# Patient Record
Sex: Female | Born: 1990 | Race: Black or African American | Hispanic: No | Marital: Married | Smoking: Never smoker
Health system: Southern US, Community
[De-identification: ages and names within clinical notes are randomized; demographics above are authoritative.]

---

## 2016-11-17 ENCOUNTER — Emergency Department (HOSPITAL_COMMUNITY): Payer: BLUE CROSS/BLUE SHIELD

## 2016-11-17 ENCOUNTER — Emergency Department (HOSPITAL_COMMUNITY)
Admission: EM | Admit: 2016-11-17 | Discharge: 2016-11-17 | Disposition: A | Discharge status: Home / Self Care | Payer: BLUE CROSS/BLUE SHIELD | Attending: Emergency Medicine | Admitting: Emergency Medicine

## 2016-11-17 ENCOUNTER — Encounter (HOSPITAL_COMMUNITY): Payer: Self-pay | Admitting: Emergency Medicine

## 2016-11-17 DIAGNOSIS — R1030 Lower abdominal pain, unspecified: Secondary | ICD-10-CM | POA: Insufficient documentation

## 2016-11-17 DIAGNOSIS — R112 Nausea with vomiting, unspecified: Secondary | ICD-10-CM | POA: Diagnosis present

## 2016-11-17 DIAGNOSIS — Z79899 Other long term (current) drug therapy: Secondary | ICD-10-CM | POA: Diagnosis not present

## 2016-11-17 DIAGNOSIS — R197 Diarrhea, unspecified: Secondary | ICD-10-CM | POA: Diagnosis not present

## 2016-11-17 DIAGNOSIS — R740 Nonspecific elevation of levels of transaminase and lactic acid dehydrogenase [LDH]: Secondary | ICD-10-CM | POA: Diagnosis not present

## 2016-11-17 DIAGNOSIS — R52 Pain, unspecified: Secondary | ICD-10-CM

## 2016-11-17 DIAGNOSIS — R7401 Elevation of levels of liver transaminase levels: Secondary | ICD-10-CM

## 2016-11-17 LAB — COMPREHENSIVE METABOLIC PANEL
ALBUMIN: 3.8 g/dL (ref 3.5–5.0)
ALK PHOS: 97 U/L (ref 38–126)
ALT: 199 U/L — ABNORMAL HIGH (ref 14–54)
ANION GAP: 10 (ref 5–15)
AST: 157 U/L — ABNORMAL HIGH (ref 15–41)
BUN: 8 mg/dL (ref 6–20)
CO2: 24 mmol/L (ref 22–32)
CREATININE: 0.83 mg/dL (ref 0.44–1.00)
Calcium: 9.8 mg/dL (ref 8.9–10.3)
Chloride: 102 mmol/L (ref 101–111)
GFR calc non Af Amer: >60 mL/min (ref >60)
Glucose, Bld: 112 mg/dL — ABNORMAL HIGH (ref 65–99)
POTASSIUM: 3.2 mmol/L — AB (ref 3.5–5.1)
SODIUM: 136 mmol/L (ref 135–145)
Total Bilirubin: 0.8 mg/dL (ref 0.3–1.2)
Total Protein: 8.3 g/dL — ABNORMAL HIGH (ref 6.5–8.1)

## 2016-11-17 LAB — I-STAT BETA HCG BLOOD, ED (MC, WL, AP ONLY)

## 2016-11-17 LAB — CBC
HCT: 45.5 % (ref 36.0–46.0)
Hemoglobin: 15.8 g/dL — ABNORMAL HIGH (ref 12.0–15.0)
MCH: 30.9 pg (ref 26.0–34.0)
MCHC: 34.7 g/dL (ref 30.0–36.0)
MCV: 89 fL (ref 78.0–100.0)
PLATELETS: 342 10*3/uL (ref 150–400)
RBC: 5.11 MIL/uL (ref 3.87–5.11)
RDW: 12.8 % (ref 11.5–15.5)
WBC: 5.3 10*3/uL (ref 4.0–10.5)

## 2016-11-17 LAB — URINALYSIS, ROUTINE W REFLEX MICROSCOPIC
BILIRUBIN URINE: NEGATIVE
Bacteria, UA: NONE SEEN
Glucose, UA: NEGATIVE mg/dL
Hgb urine dipstick: NEGATIVE
Ketones, ur: NEGATIVE mg/dL
Leukocytes, UA: NEGATIVE
Nitrite: NEGATIVE
Protein, ur: 30 mg/dL — AB
SPECIFIC GRAVITY, URINE: 1.021 (ref 1.005–1.030)
pH: 6 (ref 5.0–8.0)

## 2016-11-17 LAB — WET PREP, GENITAL
Sperm: NONE SEEN
TRICH WET PREP: NONE SEEN
YEAST WET PREP: NONE SEEN

## 2016-11-17 LAB — PROTIME-INR
INR: 1.07
Prothrombin Time: 13.9 seconds (ref 11.4–15.2)

## 2016-11-17 LAB — LIPASE, BLOOD: Lipase: 18 U/L (ref 11–51)

## 2016-11-17 MED ORDER — TRAMADOL HCL 50 MG PO TABS: 50.0000 mg | ORAL_TABLET | Freq: Four times a day (QID) | ORAL | 0 refills | Status: DC | PRN

## 2016-11-17 MED ORDER — IOPAMIDOL (ISOVUE-300) INJECTION 61%
INTRAVENOUS | Status: AC
  Administered 2016-11-17: 100 mL
  Filled 2016-11-17: qty 100

## 2016-11-17 MED ORDER — ONDANSETRON HCL 4 MG PO TABS: 4.0000 mg | ORAL_TABLET | Freq: Three times a day (TID) | ORAL | 0 refills | Status: DC | PRN

## 2016-11-17 MED ORDER — ONDANSETRON 4 MG PO TBDP
4.0000 mg | ORAL_TABLET | Freq: Once | ORAL | Status: AC | PRN
  Administered 2016-11-17: 4 mg via ORAL

## 2016-11-17 MED ORDER — FAMOTIDINE IN NACL 20-0.9 MG/50ML-% IV SOLN
20.0000 mg | Freq: Once | INTRAVENOUS | Status: AC
  Administered 2016-11-17: 20 mg via INTRAVENOUS
  Filled 2016-11-17: qty 50

## 2016-11-17 MED ORDER — MORPHINE SULFATE (PF) 4 MG/ML IV SOLN
4.0000 mg | Freq: Once | INTRAVENOUS | Status: AC
  Administered 2016-11-17: 4 mg via INTRAVENOUS
  Filled 2016-11-17: qty 1

## 2016-11-17 MED ORDER — ONDANSETRON HCL 4 MG/2ML IJ SOLN
4.0000 mg | Freq: Once | INTRAMUSCULAR | Status: AC
  Administered 2016-11-17: 4 mg via INTRAVENOUS
  Filled 2016-11-17: qty 2

## 2016-11-17 MED ORDER — ONDANSETRON 4 MG PO TBDP
ORAL_TABLET | ORAL | Status: DC
Start: 2016-11-17 — End: 2016-11-17
  Filled 2016-11-17: qty 1

## 2016-11-17 MED ORDER — HYDROCODONE-ACETAMINOPHEN 5-325 MG PO TABS
1.0000 | ORAL_TABLET | Freq: Once | ORAL | Status: AC
  Administered 2016-11-17: 1 via ORAL
  Filled 2016-11-17: qty 1

## 2016-11-17 MED ORDER — SODIUM CHLORIDE 0.9 % IV BOLUS (SEPSIS)
1000.0000 mL | Freq: Once | INTRAVENOUS | Status: AC
  Administered 2016-11-17: 1000 mL via INTRAVENOUS

## 2016-11-17 NOTE — ED Provider Notes (Signed)
[facility]-EMERGENCY DEPT Provider Note   CSN: [id number] Arrival date & time: 11/17/16  0807     History   Chief Complaint Chief Complaint  Patient presents with  . Nausea  . Emesis  . Diarrhea   HPI   Blood pressure 116/80, pulse 82, temperature 98.2 F (36.8 C), temperature source Oral, resp. rate 16, last menstrual period 11/11/2016, SpO2 100 %.  Kristine Nunez is a 25 y.o. female complaining of nonbloody, nonbilious, non-coffee ground emesis with associated watery and yellowish diarrhea onset 3 days ago with severe lower abdominal pain. She denies fevers, chills, sick contacts, dysuria, hematuria, flank pain, abnormal vaginal discharge. She states that pain was severe, 10 out of 10 and after she was given Zofran in the waiting room the pain eased to 7 out of 10.  History reviewed. No pertinent past medical history.  There are no active problems to display for this patient.   Past Surgical History:  Procedure Laterality Date  . CESAREAN SECTION     x 3    OB History    No data available       Home Medications    Prior to Admission medications   Medication Sig Start Date End Date Taking? Authorizing Provider  ondansetron (ZOFRAN) 4 MG tablet Take 1 tablet (4 mg total) by mouth every 8 (eight) hours as needed for nausea or vomiting. 11/17/16   Joni Reining Besan Ketchem, PA-C    Family History No family history on file.  Social History Social History  Substance Use Topics  . Smoking status: Never Smoker  . Smokeless tobacco: Never Used  . Alcohol use No     Allergies   Patient has no known allergies.   Review of Systems Review of Systems  10 systems reviewed and found to be negative, except as noted in the HPI.   Physical Exam Updated Vital Signs BP 102/72   Pulse 76   Temp 98.2 F (36.8 C) (Oral)   Resp 16   LMP 11/11/2016   SpO2 99%   Physical Exam  Constitutional: She is oriented to person, place, and time. She appears well-developed and  well-nourished. No distress.  HENT:  Head: Normocephalic and atraumatic.  Mouth/Throat: Oropharynx is clear and moist.  Eyes: Conjunctivae and EOM are normal. Pupils are equal, round, and reactive to light.  Neck: Normal range of motion.  Cardiovascular: Normal rate, regular rhythm and intact distal pulses.   Pulmonary/Chest: Effort normal and breath sounds normal.  Abdominal: Soft. There is no tenderness.  Mild suprapubic abdominal pain with no guarding or rebound, Murphy's sign negative.  Genitourinary:  Genitourinary Comments: Pelvic exam a chaperoned by RN: No rashes or lesions, scant, white, non-foul-smelling vaginal discharge, no cervical motion or adnexal tenderness.  Musculoskeletal: Normal range of motion.  Neurological: She is alert and oriented to person, place, and time.  Skin: She is not diaphoretic.  Psychiatric: She has a normal mood and affect.  Nursing note and vitals reviewed.    ED Treatments / Results  Labs (all labs ordered are listed, but only abnormal results are displayed) Labs Reviewed  WET PREP, GENITAL - Abnormal; Notable for the following:       Result Value   Clue Cells Wet Prep HPF POC PRESENT (*)    WBC, Wet Prep HPF POC FEW (*)    All other components within normal limits  COMPREHENSIVE METABOLIC PANEL - Abnormal; Notable for the following:    Potassium 3.2 (*)    Glucose, Bld 112 (*)  Total Protein 8.3 (*)    AST 157 (*)    ALT 199 (*)    All other components within normal limits  CBC - Abnormal; Notable for the following:    Hemoglobin 15.8 (*)    All other components within normal limits  URINALYSIS, ROUTINE W REFLEX MICROSCOPIC - Abnormal; Notable for the following:    Color, Urine AMBER (*)    Protein, ur 30 (*)    Squamous Epithelial / LPF 0-5 (*)    All other components within normal limits  LIPASE, BLOOD  PROTIME-INR  HEPATITIS PANEL, ACUTE  RPR  HIV ANTIBODY (ROUTINE TESTING)  I-STAT BETA HCG BLOOD, ED ([facility], [facility], [facility] ONLY)    GC/CHLAMYDIA PROBE AMP (Susan Moore) NOT AT [facility]    EKG  EKG Interpretation None       Radiology Ct Abdomen Pelvis W Contrast  Result Date: 11/17/2016 CLINICAL DATA:  Abdomen pain, nausea vomiting for 3 days. EXAM: CT ABDOMEN AND PELVIS WITH CONTRAST TECHNIQUE: Multidetector CT imaging of the abdomen and pelvis was performed using the standard protocol following bolus administration of intravenous contrast. CONTRAST:  100mL ISOVUE-300 IOPAMIDOL (ISOVUE-300) INJECTION 61% COMPARISON:  None. FINDINGS: Lower chest: There is atelectasis of the bilateral posterior lung bases. Heart size is normal. Hepatobiliary: No focal lesion is identified in the liver. There is hazy stranding surrounding the gallbladder. The biliary tree is normal. Pancreas: Unremarkable. No pancreatic ductal dilatation or surrounding inflammatory changes. Spleen: Normal in size without focal abnormality. Adrenals/Urinary Tract: Adrenal glands are unremarkable. Kidneys are normal, without renal calculi, focal lesion, or hydronephrosis. Bladder is unremarkable. Stomach/Bowel: Stomach is within normal limits. Appendix appears normal. No evidence of bowel wall thickening, distention, or inflammatory changes. Vascular/Lymphatic: No significant vascular findings are present. No enlarged abdominal or pelvic lymph nodes. Reproductive: Small recently ruptured right ovarian cyst is identified. The uterus is normal. Other: No abdominal wall hernia or abnormality. No abdominopelvic ascites. Musculoskeletal: No acute or significant osseous findings. IMPRESSION: Hazy stranding surrounding the gallbladder which may be due to. Gallbladder inflammatory change. Consider further evaluation with ultrasound of gallbladder. Recently ruptured small right ovarian cyst. Electronically Signed   By: Sherian ReinWei-Chen  Lin M.D.   On: 11/17/2016 16:22   [place] Abdomen Limited Ruq  Result Date: 11/17/2016 CLINICAL DATA:  Right upper quadrant pain x3 days EXAM: US ABDOMEN  LIMITED - RIGHT UPPER QUADRANT COMPARISON:  None. FINDINGS: Gallbladder: Biliary sludge within the gallbladder with cholelithiasis noted. No sonographic Eulah PontMurphy sign reported. No pericholecystic fluid is identified. Gallbladder wall thickness is top normal at 3 mm. Common bile duct: Diameter: 4 mm Liver: No focal lesion identified. Left lobe of the liver was obscured by bowel gas. Within normal limits in parenchymal echogenicity. IMPRESSION: Cholelithiasis with biliary sludge. No conclusive evidence for acute cholecystitis. Electronically Signed   By: Tollie Ethavid  Kwon M.D.   On: 11/17/2016 18:01    Procedures Procedures (including critical care time)  Medications Ordered in ED Medications  ondansetron (ZOFRAN-ODT) 4 MG disintegrating tablet (not administered)  ondansetron (ZOFRAN-ODT) disintegrating tablet 4 mg (4 mg Oral Given 11/17/16 0834)  morphine 4 MG/ML injection 4 mg (4 mg Intravenous Given 11/17/16 1419)  ondansetron (ZOFRAN) injection 4 mg (4 mg Intravenous Given 11/17/16 1419)  sodium chloride 0.9 % bolus 1,000 mL (0 mLs Intravenous Stopped 11/17/16 1831)  famotidine (PEPCID) IVPB 20 mg premix (0 mg Intravenous Stopped 11/17/16 1658)  iopamidol (ISOVUE-300) 61 % injection (100 mLs  Contrast Given 11/17/16 1526)     Initial Impression /  Assessment and Plan / ED Course  I have reviewed the triage vital signs and the nursing notes.  Pertinent labs & imaging results that were available during my care of the patient were reviewed by me and considered in my medical decision making (see chart for details).  Clinical Course     Vitals:   11/17/16 1815 11/17/16 1830 11/17/16 1845 11/17/16 1900  BP: 104/71 95/73 106/73 102/72  Pulse: 74 68 102 76  Resp:      Temp:      TempSrc:      SpO2: 99% 99% 97% 99%    Medications  ondansetron (ZOFRAN-ODT) 4 MG disintegrating tablet (not administered)  ondansetron (ZOFRAN-ODT) disintegrating tablet 4 mg (4 mg Oral Given 11/17/16 0834)  morphine  4 MG/ML injection 4 mg (4 mg Intravenous Given 11/17/16 1419)  ondansetron (ZOFRAN) injection 4 mg (4 mg Intravenous Given 11/17/16 1419)  sodium chloride 0.9 % bolus 1,000 mL (0 mLs Intravenous Stopped 11/17/16 1831)  famotidine (PEPCID) IVPB 20 mg premix (0 mg Intravenous Stopped 11/17/16 1658)  iopamidol (ISOVUE-300) 61 % injection (100 mLs  Contrast Given 11/17/16 1526)    Nayzeth Gubler is 25 y.o. female presenting with Nausea vomiting diarrhea onset 3 days ago with severe lower abdominal pain. Abdominal exam is nonsurgical. Patient with a moderate transaminitis AST 150, ALT over 190. Pelvic exam benign. Will obtain CT, acute hepatitis and INR. Patient given morphine and Zofran for comfort.  Wet prep with clue cells however she is not complaining of any vaginal discharge. INR is normal.  CT was an abnormality in the right upper quadrant they recommend evaluating with an ultrasound, ultrasound with no signs of cholecystitis. Repeat abdominal exam remains nonsurgical. Patient states she feels much better, is tolerating by mouth's. Counseled her on return precautions. Patient is given Zofran and work note.   Evaluation does not show pathology that would require ongoing emergent intervention or inpatient treatment. Pt is hemodynamically stable and mentating appropriately. Discussed findings and plan with patient/guardian, who agrees with care plan. All questions answered. Return precautions discussed and outpatient follow up given.    Final Clinical Impressions(s) / ED Diagnoses   Final diagnoses:  Pain  Transaminitis  Nausea vomiting and diarrhea    New Prescriptions New Prescriptions   ONDANSETRON (ZOFRAN) 4 MG TABLET    Take 1 tablet (4 mg total) by mouth every 8 (eight) hours as needed for nausea or vomiting.     Wynetta Emeryicole Adaisha Campise, PA-C 11/17/16 1907    Gwyneth SproutWhitney Plunkett, MD 11/19/16 1920

## 2016-11-17 NOTE — ED Notes (Signed)
Pt requested rn discuss stomach pain with EDP prior to d/c.

## 2016-11-17 NOTE — ED Triage Notes (Signed)
Swahili interpreter used via [employer].

## 2016-11-17 NOTE — ED Notes (Signed)
Pt transported to CT ?

## 2016-11-17 NOTE — Discharge Instructions (Signed)
°  Push maji maji: kuchukua sips ya mara kwa mara ya maji au Gatorade, usinywe soda yoyote, juisi au vinywaji vya caffeinated.  Punguza polepole chakula cha imara kama unavyotaka. Epuka chakula ambacho ni spicy, kina maziwa na / au una maudhui ya juu ya mafuta.  NSAID za Aviod (aspirin, motrin, ibuprofen, naproxen, Aleve na [place]ika) kwa udhibiti wa maumivu kwa sababu watawashawishi tumbo lako.  Usisita kurudi kwenye idara ya dharura kwa ajili ya dalili yoyote mpya, mbaya au kuhusu dalili.  Push fluids: take small frequent sips of water or Gatorade, do not drink any soda, juice or caffeinated beverages.    Slowly resume solid diet as desired. Avoid food that are spicy, contain dairy and/or have high fat content.  Aviod NSAIDs (aspirin, motrin, ibuprofen, naproxen, Aleve et Karie Sodacetera) for pain control because they will irritate your stomach.  Do not Hesitate to return to the emergency department for any new, worsening or concerning symptoms.

## 2016-11-17 NOTE — ED Notes (Signed)
Pt informed of her upcoming US scan via translator phone. EDP present for call.

## 2016-11-18 LAB — HEPATITIS PANEL, ACUTE
HCV Ab: <0.1 s/co ratio (ref 0.0–0.9)
HEP A IGM: NEGATIVE
HEP B C IGM: NEGATIVE
HEP B S AG: NEGATIVE

## 2016-11-18 LAB — HIV ANTIBODY (ROUTINE TESTING W REFLEX): HIV Screen 4th Generation wRfx: NONREACTIVE

## 2016-11-18 LAB — RPR: RPR: NONREACTIVE

## 2016-11-20 LAB — GC/CHLAMYDIA PROBE AMP (~~LOC~~) NOT AT ARMC
CHLAMYDIA, DNA PROBE: NEGATIVE
Neisseria Gonorrhea: NEGATIVE

## 2017-04-19 DIAGNOSIS — E669 Obesity, unspecified: Secondary | ICD-10-CM

## 2017-04-19 LAB — GLUCOSE, POCT (MANUAL RESULT ENTRY): POC GLUCOSE: 116 mg/dL — AB (ref 70–99)

## 2017-04-19 NOTE — Congregational Nurse Program (Signed)
Congregational Nurse Program Note  Date of Encounter: 04/19/2017  Past Medical History: No past medical history on file.  Encounter Details:     CNP Questionnaire - 04/19/17 1329      Patient Demographics   Is this a new or existing patient? New   Patient is considered a/an Refugee   Race African     Patient Assistance   Location of Patient Assistance [place]   Patient's financial/insurance status Self-Pay (Uninsured)   Uninsured Patient (Orange Research officer, trade unionCard/Care Connects) Yes   Interventions Not Applicable   Patient referred to apply for the following financial assistance [employer]/Care Connects   Food insecurities addressed Not Technical brewerApplicable   Transportation assistance No   Assistance securing medications No   Engineer, siteducational health offerings Health literacy;Navigating the healthcare system     Encounter Details   Primary purpose of visit Navigating the Healthcare System   Does patient have a medical provider? No   Patient referred to Other  will make appointment for irange card first   Was a mental health screening completed? (GAINS tool) No   Does patient have dental issues? No   Does patient have vision issues? No   Does your patient have an abnormal blood pressure today? No   Since previous encounter, have you referred patient for abnormal blood pressure that resulted in a new diagnosis or medication change? No   Does your patient have an abnormal blood glucose today? No   Since previous encounter, have you referred patient for abnormal blood glucose that resulted in a new diagnosis or medication change? No   Was there a life-saving intervention made? No     Client came to the center to enquire about navigating healthcare.Client and her spouse are uninsured. Information regarding orange card provided.Client will come back Monday with her husband for further explanation. Nicole CellaDorothy Loral Campi RN,BSN PCCN.CNP. [phone]

## 2017-04-29 NOTE — Congregational Nurse Program (Signed)
Congregational Nurse Program Note  Date of Encounter: 04/29/2017  Past Medical History: No past medical history on file.  Encounter Details:     CNP Questionnaire - 04/29/17 1200      Patient Demographics   Is this a new or existing patient? Existing   Patient is considered a/an Refugee   Race African     Patient Assistance   Location of Patient Assistance [place]   Patient's financial/insurance status Self-Pay (Uninsured)   Uninsured Patient (Or[employer] Research officer, trade unionCard/Care Connects) Yes   Interventions Not Applicable   Patient referred to apply for the following financial assistance [employer]/Care Connects   Food insecurities addressed Not Technical brewerApplicable   Transportation assistance No   Assistance securing medications No   Engineer, siteducational health offerings Health literacy;Navigating the healthcare system     Encounter Details   Primary purpose of visit Navigating the Healthcare System   Was an Emergency Department visit averted? Not Applicable   Does patient have a medical provider? No   Patient referred to Other  infromation given regarding orange card    Was a mental health screening completed? (GAINS tool) No   Does patient have dental issues? No   Does patient have vision issues? No   Does your patient have an abnormal blood pressure today? No   Since previous encounter, have you referred patient for abnormal blood pressure that resulted in a new diagnosis or medication change? No   Does your patient have an abnormal blood glucose today? No   Since previous encounter, have you referred patient for abnormal blood glucose that resulted in a new diagnosis or medication change? No   Was there a life-saving intervention made? No     client arrived to community center accompanied by husband foe information regarding orange card application. Client provided with DSS address and [facility] address. Adviced to go [facility] on June 21st at 0730 for application. Arman Bogusorothy  Brylan Dec RN BSN PCCN [phone]

## 2017-05-02 ENCOUNTER — Telehealth: Payer: Self-pay

## 2017-05-10 NOTE — Telephone Encounter (Signed)
Client stopped by the center requesting assistance with Medicaid cards-Education regarding health care navigation provided. Nicole CellaDorothy Muhoro RN,BSN,PCCN,CNP [phone]

## 2017-05-24 NOTE — Congregational Nurse Program (Signed)
Congregational Nurse Program Note  Date of Encounter: 05/24/2017  Past Medical History: No past medical history on file.  Encounter Details:     CNP Questionnaire - 05/24/17 1400      Patient Demographics   Is this a new or existing patient? Existing   Patient is considered a/an Refugee   Race African     Patient Assistance   Location of Patient Assistance [place] [facility]   Patient's financial/insurance status Self-Pay (Uninsured)   Uninsured Patient (Or[employer] Research officer, trade unionCard/Care Connects) Yes   Interventions Not Applicable   Patient referred to apply for the following financial assistance Not Applicable   Food insecurities addressed Not Applicable   Transportation assistance No   Assistance securing medications No   Educational health offerings Health literacy;Navigating the healthcare system     Encounter Details   Primary purpose of visit Other  blood pressure check   Was an Emergency Department visit averted? Not Applicable   Does patient have a medical provider? No   Patient referred to Not Applicable   Was a mental health screening completed? (GAINS tool) No   Does patient have dental issues? No   Does patient have vision issues? No   Does your patient have an abnormal blood pressure today? No   Since previous encounter, have you referred patient for abnormal blood pressure that resulted in a new diagnosis or medication change? No   Does your patient have an abnormal blood glucose today? No   Since previous encounter, have you referred patient for abnormal blood glucose that resulted in a new diagnosis or medication change? No   Was there a life-saving intervention made? No    client stopped by the center for blood pressure checks. No other health concerns. Nicole Cellaorothy Carel Schnee RN BSN PCCN CNP [phone]

## 2017-05-31 NOTE — Congregational Nurse Program (Signed)
Congregational Nurse Program Note  Date of Encounter: 05/31/2017  Past Medical History: No past medical history on file.  Encounter Details:     CNP Questionnaire - 05/31/17 1300      Patient Demographics   Is this a new or existing patient? Existing   Patient is considered a/an Refugee   Race African     Patient Assistance   Location of Patient Assistance [place]   Patient's financial/insurance status Self-Pay (Uninsured)   Uninsured Patient (Orange Card/Care Connects) Yes   Interventions Not Applicable  Orange card application done   Patient referred to apply for the following financial assistance Orange [employer]Card/Care Connects   Food insecurities addressed Not Technical brewerApplicable   Transportation assistance No   Assistance securing medications No   Engineer, siteducational health offerings Health literacy;Navigating the healthcare system     Encounter Details   Primary purpose of visit Other  Orange card application   Was an Emergency Department visit averted? Not Applicable   Does patient have a medical provider? No   Patient referred to Not Applicable   Was a mental health screening completed? (GAINS tool) No   Does patient have dental issues? No   Does patient have vision issues? No   Does your patient have an abnormal blood pressure today? No   Since previous encounter, have you referred patient for abnormal blood pressure that resulted in a new diagnosis or medication change? No   Does your patient have an abnormal blood glucose today? No   Since previous encounter, have you referred patient for abnormal blood glucose that resulted in a new diagnosis or medication change? No   Was there a life-saving intervention made? No    client stopped by the center for assistance with orange card application.

## 2017-11-19 DIAGNOSIS — K819 Cholecystitis, unspecified: Secondary | ICD-10-CM

## 2017-11-19 HISTORY — DX: Cholecystitis, unspecified: K81.9

## 2018-01-27 ENCOUNTER — Observation Stay (HOSPITAL_COMMUNITY)
Admission: EM | Admit: 2018-01-27 | Discharge: 2018-01-29 | Disposition: A | Discharge status: Home / Self Care | Payer: BLUE CROSS/BLUE SHIELD | Attending: Surgery | Admitting: Surgery

## 2018-01-27 ENCOUNTER — Encounter (HOSPITAL_COMMUNITY): Payer: Self-pay

## 2018-01-27 ENCOUNTER — Other Ambulatory Visit: Payer: Self-pay

## 2018-01-27 ENCOUNTER — Emergency Department (HOSPITAL_COMMUNITY): Payer: BLUE CROSS/BLUE SHIELD

## 2018-01-27 DIAGNOSIS — K8000 Calculus of gallbladder with acute cholecystitis without obstruction: Secondary | ICD-10-CM | POA: Diagnosis not present

## 2018-01-27 DIAGNOSIS — K810 Acute cholecystitis: Secondary | ICD-10-CM

## 2018-01-27 DIAGNOSIS — R1010 Upper abdominal pain, unspecified: Secondary | ICD-10-CM | POA: Diagnosis present

## 2018-01-27 DIAGNOSIS — K429 Umbilical hernia without obstruction or gangrene: Secondary | ICD-10-CM | POA: Diagnosis not present

## 2018-01-27 DIAGNOSIS — Z419 Encounter for procedure for purposes other than remedying health state, unspecified: Secondary | ICD-10-CM

## 2018-01-27 LAB — URINALYSIS, ROUTINE W REFLEX MICROSCOPIC
Bilirubin Urine: NEGATIVE
Glucose, UA: NEGATIVE mg/dL
Ketones, ur: NEGATIVE mg/dL
Leukocytes, UA: NEGATIVE
Nitrite: NEGATIVE
PH: 7 (ref 5.0–8.0)
Protein, ur: NEGATIVE mg/dL
SPECIFIC GRAVITY, URINE: 1.003 — AB (ref 1.005–1.030)

## 2018-01-27 LAB — COMPREHENSIVE METABOLIC PANEL WITH GFR
ALT: 19 U/L (ref 14–54)
AST: 21 U/L (ref 15–41)
Albumin: 3.7 g/dL (ref 3.5–5.0)
Alkaline Phosphatase: 74 U/L (ref 38–126)
Anion gap: 8 (ref 5–15)
BUN: 5 mg/dL — ABNORMAL LOW (ref 6–20)
CO2: 25 mmol/L (ref 22–32)
Calcium: 8.8 mg/dL — ABNORMAL LOW (ref 8.9–10.3)
Chloride: 101 mmol/L (ref 101–111)
Creatinine, Ser: 0.79 mg/dL (ref 0.44–1.00)
GFR calc Af Amer: >60 mL/min
GFR calc non Af Amer: >60 mL/min
Glucose, Bld: 133 mg/dL — ABNORMAL HIGH (ref 65–99)
Potassium: 3.8 mmol/L (ref 3.5–5.1)
Sodium: 134 mmol/L — ABNORMAL LOW (ref 135–145)
Total Bilirubin: 0.6 mg/dL (ref 0.3–1.2)
Total Protein: 7.6 g/dL (ref 6.5–8.1)

## 2018-01-27 LAB — CBC
HCT: 39.7 % (ref 36.0–46.0)
HEMOGLOBIN: 13.6 g/dL (ref 12.0–15.0)
MCH: 30.8 pg (ref 26.0–34.0)
MCHC: 34.3 g/dL (ref 30.0–36.0)
MCV: 90 fL (ref 78.0–100.0)
Platelets: 319 10*3/uL (ref 150–400)
RBC: 4.41 MIL/uL (ref 3.87–5.11)
RDW: 12.6 % (ref 11.5–15.5)
WBC: 7.5 10*3/uL (ref 4.0–10.5)

## 2018-01-27 LAB — LIPASE, BLOOD: Lipase: 26 U/L (ref 11–51)

## 2018-01-27 LAB — I-STAT BETA HCG BLOOD, ED (MC, WL, AP ONLY)

## 2018-01-27 MED ORDER — OXYCODONE-ACETAMINOPHEN 5-325 MG PO TABS: 1.0000 | ORAL_TABLET | Freq: Once | ORAL | Status: DC

## 2018-01-27 MED ORDER — SODIUM CHLORIDE 0.9 % IV BOLUS (SEPSIS)
1000.0000 mL | Freq: Once | INTRAVENOUS | Status: AC
  Administered 2018-01-27: 1000 mL via INTRAVENOUS

## 2018-01-27 MED ORDER — SODIUM CHLORIDE 0.9 % IV SOLN
2.0000 g | Freq: Once | INTRAVENOUS | Status: AC
  Administered 2018-01-27: 2 g via INTRAVENOUS
  Filled 2018-01-27: qty 20

## 2018-01-27 MED ORDER — HYDROMORPHONE HCL 1 MG/ML IJ SOLN
1.0000 mg | INTRAMUSCULAR | Status: DC | PRN
  Administered 2018-01-28 – 2018-01-29 (×4): 1 mg via INTRAVENOUS
  Filled 2018-01-27 (×4): qty 1

## 2018-01-27 MED ORDER — KCL IN DEXTROSE-NACL 40-5-0.9 MEQ/L-%-% IV SOLN
INTRAVENOUS | Status: DC
  Administered 2018-01-27: 22:00:00 via INTRAVENOUS
  Administered 2018-01-28: 100 mL/h via INTRAVENOUS
  Administered 2018-01-28: 20:00:00 via INTRAVENOUS
  Filled 2018-01-27 (×6): qty 1000

## 2018-01-27 MED ORDER — GI COCKTAIL ~~LOC~~: 30.0000 mL | Freq: Once | ORAL | Status: DC

## 2018-01-27 MED ORDER — HYDROMORPHONE HCL 1 MG/ML IJ SOLN
0.5000 mg | Freq: Once | INTRAMUSCULAR | Status: AC
  Administered 2018-01-27: 0.5 mg via INTRAVENOUS
  Filled 2018-01-27: qty 1

## 2018-01-27 MED ORDER — SODIUM CHLORIDE 0.9 % IV SOLN
2.0000 g | INTRAVENOUS | Status: DC
  Administered 2018-01-28: 2 g via INTRAVENOUS
  Filled 2018-01-27 (×2): qty 20

## 2018-01-27 MED ORDER — ONDANSETRON 4 MG PO TBDP: 8.0000 mg | ORAL_TABLET | Freq: Once | ORAL | Status: DC

## 2018-01-27 MED ORDER — ONDANSETRON HCL 4 MG/2ML IJ SOLN
4.0000 mg | Freq: Four times a day (QID) | INTRAMUSCULAR | Status: DC | PRN
  Administered 2018-01-28 (×2): 4 mg via INTRAVENOUS
  Filled 2018-01-27 (×2): qty 2

## 2018-01-27 MED ORDER — ONDANSETRON 4 MG PO TBDP
4.0000 mg | ORAL_TABLET | Freq: Four times a day (QID) | ORAL | Status: DC | PRN
  Filled 2018-01-27: qty 1

## 2018-01-27 NOTE — ED Triage Notes (Signed)
Using Swahili interpreter [phone] Pt sent here by PCP for epigastric pain. Pt reports episode started last night at 0200. Pt reports generalized abdominal pain. Pt also reports vomiting. LMP: currently.

## 2018-01-27 NOTE — ED Notes (Signed)
Pt was able to transfer from the stretcher to the bed without difficulty.  She also ambulated to the BR with steady gait.

## 2018-01-27 NOTE — ED Provider Notes (Signed)
[facility] Provider Note   CSN: [id number] Arrival date & time: 01/27/18  1612     History   Chief Complaint Chief Complaint  Patient presents with  . Abdominal Pain    HPI Kristine Nunez is a 27 y.o. female.  HPI patient's pastor is at bedside along with husband who provide translation  27 year old female presents today with complaints of right upper quadrant abdominal pain.  Patient notes a significant past medical history of the same that resolved on its own.  She notes yesterday she had right upper quadrant pain, nausea and vomiting.  Patient denies any lower abdominal pain, denies any fever at home.  Patient reports history of cesarean section.  She notes last p.o. was water prior to my evaluation, notes drinking orange juice this morning but no solid foods today.  Patient denies any significant past medical history and does not take regular medication.  History reviewed. No pertinent past medical history.  There are no active problems to display for this patient.   Past Surgical History:  Procedure Laterality Date  . CESAREAN SECTION     x 3    OB History    No data available       Home Medications    Prior to Admission medications   Medication Sig Start Date End Date Taking? Authorizing Provider  ondansetron (ZOFRAN) 4 MG tablet Take 1 tablet (4 mg total) by mouth every 8 (eight) hours as needed for nausea or vomiting. 11/17/16   Pisciotta, Joni ReiningNicole, PA-C  traMADol (ULTRAM) 50 MG tablet Take 1 tablet (50 mg total) by mouth every 6 (six) hours as needed. 11/17/16   Pisciotta, Mardella LaymanNicole, PA-C    Family History History reviewed. No pertinent family history.  Social History Social History   Tobacco Use  . Smoking status: Never Smoker  . Smokeless tobacco: Never Used  Substance Use Topics  . Alcohol use: No  . Drug use: No     Allergies   Patient has no known allergies.   Review of Systems Review of Systems  All  other systems reviewed and are negative.   Physical Exam Updated Vital Signs BP (!) 139/96 (BP Location: Right Arm)   Pulse 75   Temp 98.9 F (37.2 C) (Oral)   Resp 18   LMP 01/27/2018 (Within Days)   SpO2 100%   Physical Exam  Constitutional: She is oriented to person, place, and time. She appears well-developed and well-nourished.  HENT:  Head: Normocephalic and atraumatic.  Eyes: Conjunctivae are normal. Pupils are equal, round, and reactive to light. Right eye exhibits no discharge. Left eye exhibits no discharge. No scleral icterus.  Neck: Normal range of motion. No JVD present. No tracheal deviation present.  Pulmonary/Chest: Effort normal. No stridor.  Abdominal:  Tenderness palpation right upper quadrant with guarding, remainder exam nontender  Neurological: She is alert and oriented to person, place, and time. Coordination normal.  Psychiatric: She has a normal mood and affect. Her behavior is normal. Judgment and thought content normal.  Nursing note and vitals reviewed.    ED Treatments / Results  Labs (all labs ordered are listed, but only abnormal results are displayed) Labs Reviewed  COMPREHENSIVE METABOLIC PANEL - Abnormal; Notable for the following components:      Result Value   Sodium 134 (*)    Glucose, Bld 133 (*)    BUN 5 (*)    Calcium 8.8 (*)    All other components within normal limits  LIPASE, BLOOD  CBC  URINALYSIS, ROUTINE W REFLEX MICROSCOPIC  I-STAT BETA HCG BLOOD, ED ([facility], [facility], [facility] ONLY)    EKG  EKG Interpretation None       Radiology US Abdomen Complete  Result Date: 01/27/2018 CLINICAL DATA:  Abdominal pain EXAM: ABDOMEN ULTRASOUND COMPLETE COMPARISON:  11/17/2016 abdominal sonogram and CT abdomen/pelvis. FINDINGS: Gallbladder: Gallbladder is completely filled with shadowing gallstones. Diffuse moderate gallbladder wall thickening. Sonographic Eulah Pont sign is present. No pericholecystic fluid. Common bile duct: Diameter: 3 mm Liver:  No focal lesion identified. Within normal limits in parenchymal echogenicity. Portal vein is patent on color Doppler imaging with normal direction of blood flow towards the liver. IVC: No abnormality visualized. Pancreas: Visualized portion unremarkable. Spleen: Size and appearance within normal limits. Right Kidney: Length: 7.9 cm. Echogenicity within normal limits. No mass or hydronephrosis visualized. Left Kidney: Length: 8.6 cm. Echogenicity within normal limits. No mass or hydronephrosis visualized. Abdominal aorta: No aneurysm visualized. Other findings: None. IMPRESSION: 1. Cholelithiasis. Moderate diffuse gallbladder wall thickening. Sonographic Murphy sign. Findings are compatible with acute cholecystitis. 2. No biliary ductal dilatation. 3. Otherwise normal abdominal sonogram. Electronically Signed   By: Delbert Phenix M.D.   On: 01/27/2018 18:45    Procedures Procedures (including critical care time)  Medications Ordered in ED Medications  oxyCODONE-acetaminophen (PERCOCET/ROXICET) 5-325 MG per tablet 1 tablet (1 tablet Oral Not Given 01/27/18 1954)  ondansetron (ZOFRAN-ODT) disintegrating tablet 8 mg (8 mg Oral Not Given 01/27/18 1954)  gi cocktail (Maalox,Lidocaine,Donnatal) (30 mLs Oral Not Given 01/27/18 1954)  cefTRIAXone (ROCEPHIN) 2 g in sodium chloride 0.9 % 100 mL IVPB (not administered)  HYDROmorphone (DILAUDID) injection 0.5 mg (not administered)     Initial Impression / Assessment and Plan / ED Course  I have reviewed the triage vital signs and the nursing notes.  Pertinent labs & imaging results that were available during my care of the patient were reviewed by me and considered in my medical decision making (see chart for details).     Final Clinical Impressions(s) / ED Diagnoses   Final diagnoses:  Cholecystitis    Labs: I-STAT beta-hCG, lipase, CMP, CBC  Imaging: Right upper quadrant ultrasound  Consults: General surgery  Therapeutics: Ceftriaxone  Discharge  Meds:   Assessment/Plan: 27 year old female presents today with likely cholecystitis.  Patient afebrile with no white count, ultrasound showing cholelithiasis, wall thickening and sonographic Murphy sign.  No signs of biliary ductal dilatation or obstructive pattern on laboratory here.  Patient seen by Dr. Patria Mane at bedside, general surgery will be consulted. Dr. Derrell Lolling to come see patient here in the ED.     ED Discharge Orders    None       Rosalio Loud 01/27/18 2037    Azalia Bilis, MD 01/27/18 2252

## 2018-01-27 NOTE — H&P (Signed)
Kristine Nunez is an 27 y.o. female.   Chief Complaint: Abdominal pain HPI: Patient is a 27 year old, Swahili speaking female with a one-year history of abdominal pain.  Translation was obtained via a family friend who was in the room.  Patient states that the most recent abdominal pain began approximately 2 AM.  She states the pain was epigastric and radiates to the right upper quadrant.  Patient states that the pain was associated with nausea and vomiting.  Patient denies any diarrhea.  Secondary to continued pain patient presented to the ER for further evaluation and management.  Upon evaluation in ER she underwent ultrasound laboratory studies.  Ultrasound revealed gallstones and thickened gallbladder wall with no increased common bile duct dilatation.  Patient's LFTs and WBC was within normal limit.  General surgery was consulted for further evaluation and management  History reviewed. No pertinent past medical history.  Past Surgical History:  Procedure Laterality Date  . CESAREAN SECTION     x 3    History reviewed. No pertinent family history. Social History:  reports that  has never smoked. she has never used smokeless tobacco. She reports that she does not drink alcohol or use drugs.  Allergies: No Known Allergies   (Not in a hospital admission)  Results for orders placed or performed during the hospital encounter of 01/27/18 (from the past 48 hour(s))  Lipase, blood     Status: None   Collection Time: 01/27/18  4:53 PM  Result Value Ref Range   Lipase 26 11 - 51 U/L    Comment: Performed at [facility], [address] [place], [place] [address]  Comprehensive metabolic panel     Status: Abnormal   Collection Time: 01/27/18  4:53 PM  Result Value Ref Range   Sodium 134 (L) 135 - 145 mmol/L   Potassium 3.8 3.5 - 5.1 mmol/L   Chloride 101 101 - 111 mmol/L   CO2 25 22 - 32 mmol/L   Glucose, Bld 133 (H) 65 - 99 mg/dL   BUN 5 (L) 6 - 20 mg/dL   Creatinine, Ser 0.79  0.44 - 1.00 mg/dL   Calcium 8.8 (L) 8.9 - 10.3 mg/dL   Total Protein 7.6 6.5 - 8.1 g/dL   Albumin 3.7 3.5 - 5.0 g/dL   AST 21 15 - 41 U/L   ALT 19 14 - 54 U/L   Alkaline Phosphatase 74 38 - 126 U/L   Total Bilirubin 0.6 0.3 - 1.2 mg/dL   GFR calc non Af Amer >60 >60 mL/min   GFR calc Af Amer >60 >60 mL/min    Comment: (NOTE) The eGFR has been calculated using the CKD EPI equation. This calculation has not been validated in all clinical situations. eGFR's persistently <60 mL/min signify possible Chronic Kidney Disease.    Anion gap 8 5 - 15    Comment: Performed at [facility] [address], [place] [address]  CBC     Status: None   Collection Time: 01/27/18  4:53 PM  Result Value Ref Range   WBC 7.5 4.0 - 10.5 K/uL   RBC 4.41 3.87 - 5.11 MIL/uL   Hemoglobin 13.6 12.0 - 15.0 g/dL   HCT 39.7 36.0 - 46.0 %   MCV 90.0 78.0 - 100.0 fL   MCH 30.8 26.0 - 34.0 pg   MCHC 34.3 30.0 - 36.0 g/dL   RDW 12.6 11.5 - 15.5 %   Platelets 319 150 - 400 K/uL  Comment: Performed at [facility], [place] [address], [place], [place] [address]  I-Stat beta hCG blood, ED     Status: None   Collection Time: 01/27/18  5:08 PM  Result Value Ref Range   I-stat hCG, quantitative <5.0 <5 mIU/mL   Comment 3            Comment:   GEST. AGE      CONC.  (mIU/mL)   <=1 WEEK        5 - 50     2 WEEKS       50 - 500     3 WEEKS       100 - 10,000     4 WEEKS     1,000 - 30,000        FEMALE AND NON-PREGNANT FEMALE:     LESS THAN 5 mIU/mL    US Abdomen Complete  Result Date: 01/27/2018 CLINICAL DATA:  Abdominal pain EXAM: ABDOMEN ULTRASOUND COMPLETE COMPARISON:  11/17/2016 abdominal sonogram and CT abdomen/pelvis. FINDINGS: Gallbladder: Gallbladder is completely filled with shadowing gallstones. Diffuse moderate gallbladder wall thickening. Sonographic Percell Miller sign is present. No pericholecystic fluid. Common bile duct: Diameter: 3 mm Liver: No focal lesion identified. Within normal  limits in parenchymal echogenicity. Portal vein is patent on color Doppler imaging with normal direction of blood flow towards the liver. IVC: No abnormality visualized. Pancreas: Visualized portion unremarkable. Spleen: Size and appearance within normal limits. Right Kidney: Length: 7.9 cm. Echogenicity within normal limits. No mass or hydronephrosis visualized. Left Kidney: Length: 8.6 cm. Echogenicity within normal limits. No mass or hydronephrosis visualized. Abdominal aorta: No aneurysm visualized. Other findings: None. IMPRESSION: 1. Cholelithiasis. Moderate diffuse gallbladder wall thickening. Sonographic Murphy sign. Findings are compatible with acute cholecystitis. 2. No biliary ductal dilatation. 3. Otherwise normal abdominal sonogram. Electronically Signed   By: Ilona Sorrel M.D.   On: 01/27/2018 18:45    Review of Systems  Constitutional: Negative for chills, fever and malaise/fatigue.  HENT: Negative for ear discharge, hearing loss and sore throat.   Eyes: Negative for blurred vision and discharge.  Respiratory: Negative for cough and shortness of breath.   Cardiovascular: Negative for chest pain, orthopnea and leg swelling.  Gastrointestinal: Positive for abdominal pain, nausea and vomiting. Negative for constipation, diarrhea and heartburn.  Musculoskeletal: Negative for myalgias and neck pain.  Skin: Negative for itching and rash.  Neurological: Negative for dizziness, focal weakness, seizures and loss of consciousness.  Endo/Heme/Allergies: Negative for environmental allergies. Does not bruise/bleed easily.  Psychiatric/Behavioral: Negative for depression and suicidal ideas.  All other systems reviewed and are negative.   Blood pressure (!) 139/96, pulse 75, temperature 98.9 F (37.2 C), temperature source Oral, resp. rate 18, last menstrual period 01/27/2018, SpO2 100 %. Physical Exam  Constitutional: She is oriented to person, place, and time. Vital signs are normal. She  appears well-developed and well-nourished.  Conversant No acute distress  Eyes: Lids are normal. No scleral icterus.  No lid lag Moist conjunctiva  Neck: No tracheal tenderness present. No thyromegaly present.  No cervical lymphadenopathy  Cardiovascular: Normal rate, regular rhythm and intact distal pulses.  No murmur heard. Respiratory: Effort normal and breath sounds normal. She has no wheezes. She has no rales.  GI: Soft. She exhibits no distension. There is no hepatosplenomegaly. There is tenderness in the right upper quadrant and epigastric area. There is no rebound, no guarding and negative Murphy's sign. No hernia.  Neurological: She is alert and oriented to  person, place, and time.  Normal gait and station  Skin: Skin is warm. No rash noted. No cyanosis. Nails show no clubbing.  Normal skin turgor  Psychiatric: Judgment normal.  Appropriate affect     Assessment/Plan 27 year old female with acute cholecystitis, cholelithiasis 1.  Patient will be admitted, kept n.p.o., start on antibiotics 2.  All risks and benefits were discussed with the patient to generally include: infection, bleeding, possible need for post op ERCP, damage to the bile ducts, and bile leak. Alternatives were offered and described.  All questions were answered and the patient voiced understanding of the procedure and wishes to proceed at this point with a laparoscopic cholecystectomy   Reyes Ivan, MD 01/27/2018, 9:01 PM

## 2018-01-27 NOTE — ED Provider Notes (Signed)
Patient placed in Quick Look pathway, seen and evaluated   Chief Complaint: Aminal pain  HPI:   Patient with severe upper abdominal pain that started last night.  Reports pain is sharp, radiates all over the abdomen.  Reports associated nausea and vomiting.  ROS: Positive for abdominal pain, nausea, vomiting.  Negative for diarrhea.  Negative for urinary symptoms.  Negative for fever or chills.  Physical Exam:   Gen: No distress  Neuro: Awake and Alert  Skin: Warm    Focused Exam: Patient appears to be in severe pain.  She is grabbing her abdomen and moaning.  Abdomen diffusely tender with worse tenderness in the epigastric and right upper quadrant.  Guarding is present.  Patient with epigastric and right upper quadrant abdominal pain, worse since last night.  History of similar episodes in the past.  Micah FlesherWent to PCP and was sent here.  Exam at this time most concerning for cholecystitis.  Will get labs, will get ultrasound of her abdomen.  Percocet, Zofran, GI cocktail ordered in triage.  She will need an IV more pain medications.  Vital signs all within normal.  Vitals:   01/27/18 1646  BP: (!) 131/93  Pulse: 76  Resp: 18  Temp: 98.8 F (37.1 C)  TempSrc: Oral  SpO2: 100%     Initiation of care has begun. The patient has been counseled on the process, plan, and necessity for staying for the completion/evaluation, and the remainder of the medical screening examination    Jaynie CrumbleKirichenko, Larry Alcock, Cordelia Poche-C 01/27/18 1701    Azalia Bilisampos, Kevin, MD 01/27/18 2252

## 2018-01-28 ENCOUNTER — Observation Stay (HOSPITAL_COMMUNITY): Payer: BLUE CROSS/BLUE SHIELD | Admitting: Certified Registered"

## 2018-01-28 ENCOUNTER — Encounter (HOSPITAL_COMMUNITY)
Admission: EM | Disposition: A | Discharge status: Home / Self Care | Payer: Self-pay | Source: Home / Self Care | Attending: Emergency Medicine

## 2018-01-28 ENCOUNTER — Encounter (HOSPITAL_COMMUNITY): Payer: Self-pay | Admitting: Certified Registered"

## 2018-01-28 ENCOUNTER — Observation Stay (HOSPITAL_COMMUNITY): Payer: BLUE CROSS/BLUE SHIELD

## 2018-01-28 HISTORY — PX: CHOLECYSTECTOMY: SHX55

## 2018-01-28 LAB — SURGICAL PCR SCREEN
MRSA, PCR: NEGATIVE
STAPHYLOCOCCUS AUREUS: NEGATIVE

## 2018-01-28 SURGERY — LAPAROSCOPIC CHOLECYSTECTOMY WITH INTRAOPERATIVE CHOLANGIOGRAM
Anesthesia: General | Site: Abdomen

## 2018-01-28 MED ORDER — OXYCODONE HCL 5 MG PO TABS
5.0000 mg | ORAL_TABLET | ORAL | Status: DC | PRN
  Administered 2018-01-28 – 2018-01-29 (×3): 5 mg via ORAL
  Filled 2018-01-28 (×4): qty 1

## 2018-01-28 MED ORDER — 0.9 % SODIUM CHLORIDE (POUR BTL) OPTIME
TOPICAL | Status: DC | PRN
  Administered 2018-01-28: 1000 mL

## 2018-01-28 MED ORDER — HYDROMORPHONE HCL 1 MG/ML IJ SOLN
INTRAMUSCULAR | Status: AC
  Administered 2018-01-28: 0.5 mg via INTRAVENOUS
  Filled 2018-01-28: qty 1

## 2018-01-28 MED ORDER — MIDAZOLAM HCL 2 MG/2ML IJ SOLN
INTRAMUSCULAR | Status: AC
  Filled 2018-01-28: qty 2

## 2018-01-28 MED ORDER — ACETAMINOPHEN 500 MG PO TABS: 500.0000 mg | ORAL_TABLET | Freq: Four times a day (QID) | ORAL | Status: DC | PRN

## 2018-01-28 MED ORDER — ROCURONIUM BROMIDE 10 MG/ML (PF) SYRINGE
PREFILLED_SYRINGE | INTRAVENOUS | Status: DC | PRN
  Administered 2018-01-28: 5 mg via INTRAVENOUS
  Administered 2018-01-28: 10 mg via INTRAVENOUS
  Administered 2018-01-28: 40 mg via INTRAVENOUS

## 2018-01-28 MED ORDER — IOPAMIDOL (ISOVUE-300) INJECTION 61%
INTRAVENOUS | Status: AC
  Filled 2018-01-28: qty 50

## 2018-01-28 MED ORDER — LIDOCAINE 2% (20 MG/ML) 5 ML SYRINGE
INTRAMUSCULAR | Status: DC | PRN
  Administered 2018-01-28: 80 mg via INTRAVENOUS

## 2018-01-28 MED ORDER — LACTATED RINGERS IV SOLN
INTRAVENOUS | Status: DC
  Administered 2018-01-28 (×2): via INTRAVENOUS

## 2018-01-28 MED ORDER — SODIUM CHLORIDE 0.9 % IV SOLN
INTRAVENOUS | Status: DC | PRN
  Administered 2018-01-28: 5 mL

## 2018-01-28 MED ORDER — HYDROMORPHONE HCL 1 MG/ML IJ SOLN
0.2500 mg | INTRAMUSCULAR | Status: DC | PRN
  Administered 2018-01-28 (×4): 0.5 mg via INTRAVENOUS

## 2018-01-28 MED ORDER — PROPOFOL 10 MG/ML IV BOLUS
INTRAVENOUS | Status: DC | PRN
  Administered 2018-01-28 (×2): 20 mg via INTRAVENOUS
  Administered 2018-01-28: 30 mg via INTRAVENOUS
  Administered 2018-01-28 (×3): 20 mg via INTRAVENOUS
  Administered 2018-01-28: 150 mg via INTRAVENOUS

## 2018-01-28 MED ORDER — MIDAZOLAM HCL 5 MG/5ML IJ SOLN
INTRAMUSCULAR | Status: DC | PRN
  Administered 2018-01-28: 2 mg via INTRAVENOUS

## 2018-01-28 MED ORDER — SUGAMMADEX SODIUM 200 MG/2ML IV SOLN
INTRAVENOUS | Status: DC | PRN
  Administered 2018-01-28: 200 mg via INTRAVENOUS

## 2018-01-28 MED ORDER — BUPIVACAINE-EPINEPHRINE 0.25% -1:200000 IJ SOLN
INTRAMUSCULAR | Status: DC | PRN
  Administered 2018-01-28: 18 mL

## 2018-01-28 MED ORDER — FENTANYL CITRATE (PF) 250 MCG/5ML IJ SOLN
INTRAMUSCULAR | Status: DC | PRN
  Administered 2018-01-28 (×5): 50 ug via INTRAVENOUS

## 2018-01-28 MED ORDER — ROCURONIUM BROMIDE 10 MG/ML (PF) SYRINGE
PREFILLED_SYRINGE | INTRAVENOUS | Status: AC
  Filled 2018-01-28: qty 10

## 2018-01-28 MED ORDER — ONDANSETRON HCL 4 MG/2ML IJ SOLN
INTRAMUSCULAR | Status: DC | PRN
  Administered 2018-01-28: 4 mg via INTRAVENOUS

## 2018-01-28 MED ORDER — SODIUM CHLORIDE 0.9 % IR SOLN
Status: DC | PRN
  Administered 2018-01-28 (×2): 1000 mL

## 2018-01-28 MED ORDER — DEXAMETHASONE SODIUM PHOSPHATE 10 MG/ML IJ SOLN
INTRAMUSCULAR | Status: DC | PRN
  Administered 2018-01-28: 8 mg via INTRAVENOUS

## 2018-01-28 MED ORDER — PROPOFOL 10 MG/ML IV BOLUS
INTRAVENOUS | Status: AC
  Filled 2018-01-28: qty 20

## 2018-01-28 MED ORDER — HEMOSTATIC AGENTS (NO CHARGE) OPTIME
TOPICAL | Status: DC | PRN
  Administered 2018-01-28: 1 via TOPICAL

## 2018-01-28 MED ORDER — FENTANYL CITRATE (PF) 250 MCG/5ML IJ SOLN
INTRAMUSCULAR | Status: AC
  Filled 2018-01-28: qty 5

## 2018-01-28 MED ORDER — PROPOFOL 10 MG/ML IV BOLUS
INTRAVENOUS | Status: AC
Start: 2018-01-28 — End: 2018-01-28
  Filled 2018-01-28: qty 20

## 2018-01-28 SURGICAL SUPPLY — 46 items
APPLIER CLIP ROT 10 11.4 M/L (STAPLE) ×2
BENZOIN TINCTURE PRP APPL 2/3 (GAUZE/BANDAGES/DRESSINGS) ×2 IMPLANT
BLADE CLIPPER SURG (BLADE) IMPLANT
CANISTER SUCT 3000ML PPV (MISCELLANEOUS) ×2 IMPLANT
CHLORAPREP W/TINT 26ML (MISCELLANEOUS) ×2 IMPLANT
CLIP APPLIE ROT 10 11.4 M/L (STAPLE) ×1 IMPLANT
COVER MAYO STAND STRL (DRAPES) ×2 IMPLANT
COVER SURGICAL LIGHT HANDLE (MISCELLANEOUS) ×2 IMPLANT
DRAPE C-ARM 42X72 X-RAY (DRAPES) ×2 IMPLANT
DRSG TEGADERM 2-3/8X2-3/4 SM (GAUZE/BANDAGES/DRESSINGS) ×2 IMPLANT
DRSG TEGADERM 4X4.75 (GAUZE/BANDAGES/DRESSINGS) ×2 IMPLANT
ELECT REM PT RETURN 9FT ADLT (ELECTROSURGICAL) ×2
ELECTRODE REM PT RTRN 9FT ADLT (ELECTROSURGICAL) ×1 IMPLANT
FILTER SMOKE EVAC LAPAROSHD (FILTER) ×2 IMPLANT
GAUZE SPONGE 2X2 8PLY STRL LF (GAUZE/BANDAGES/DRESSINGS) ×1 IMPLANT
GLOVE BIO SURGEON STRL SZ7 (GLOVE) ×4 IMPLANT
GLOVE BIO SURGEON STRL SZ7.5 (GLOVE) ×2 IMPLANT
GLOVE BIOGEL PI IND STRL 7.5 (GLOVE) ×2 IMPLANT
GLOVE BIOGEL PI INDICATOR 7.5 (GLOVE) ×2
GOWN STRL REUS W/ TWL LRG LVL3 (GOWN DISPOSABLE) ×3 IMPLANT
GOWN STRL REUS W/TWL LRG LVL3 (GOWN DISPOSABLE) ×3
HEMOSTAT SNOW SURGICEL 2X4 (HEMOSTASIS) ×2 IMPLANT
KIT BASIN OR (CUSTOM PROCEDURE TRAY) ×2 IMPLANT
KIT ROOM TURNOVER OR (KITS) ×2 IMPLANT
NS IRRIG 1000ML POUR BTL (IV SOLUTION) ×2 IMPLANT
PAD ARMBOARD 7.5X6 YLW CONV (MISCELLANEOUS) ×2 IMPLANT
POUCH RETRIEVAL ECOSAC 10 (ENDOMECHANICALS) ×1 IMPLANT
POUCH RETRIEVAL ECOSAC 10MM (ENDOMECHANICALS) ×1
POUCH SPECIMEN RETRIEVAL 10MM (ENDOMECHANICALS) ×2 IMPLANT
SCISSORS LAP 5X35 DISP (ENDOMECHANICALS) ×2 IMPLANT
SET CHOLANGIOGRAPH 5 50 .035 (SET/KITS/TRAYS/PACK) ×2 IMPLANT
SET IRRIG TUBING LAPAROSCOPIC (IRRIGATION / IRRIGATOR) ×2 IMPLANT
SLEEVE ENDOPATH XCEL 5M (ENDOMECHANICALS) ×2 IMPLANT
SPECIMEN JAR SMALL (MISCELLANEOUS) ×2 IMPLANT
SPONGE GAUZE 2X2 STER 10/PKG (GAUZE/BANDAGES/DRESSINGS) ×1
STRIP CLOSURE SKIN 1/2X4 (GAUZE/BANDAGES/DRESSINGS) ×2 IMPLANT
SUT MNCRL AB 4-0 PS2 18 (SUTURE) ×2 IMPLANT
SUT VICRYL 0 UR6 27IN ABS (SUTURE) ×4 IMPLANT
TOWEL OR 17X24 6PK STRL BLUE (TOWEL DISPOSABLE) ×2 IMPLANT
TOWEL OR 17X26 10 PK STRL BLUE (TOWEL DISPOSABLE) ×2 IMPLANT
TRAY LAPAROSCOPIC MC (CUSTOM PROCEDURE TRAY) ×2 IMPLANT
TROCAR XCEL BLUNT TIP 100MML (ENDOMECHANICALS) ×2 IMPLANT
TROCAR XCEL NON-BLD 11X100MML (ENDOMECHANICALS) ×2 IMPLANT
TROCAR XCEL NON-BLD 5MMX100MML (ENDOMECHANICALS) ×2 IMPLANT
TUBING INSUFFLATION (TUBING) ×2 IMPLANT
WATER STERILE IRR 1000ML POUR (IV SOLUTION) ×2 IMPLANT

## 2018-01-28 NOTE — Transfer of Care (Signed)
Immediate Anesthesia Transfer of Care Note  Patient: Kristine Nunez  Procedure(s) Performed: LAPAROSCOPIC CHOLECYSTECTOMY WITH INTRAOPERATIVE CHOLANGIOGRAM (N/A Abdomen)  Patient Location: PACU  Anesthesia Type:General  Level of Consciousness: awake and alert   Airway & Oxygen Therapy: Patient Spontanous Breathing and Patient connected to nasal cannula oxygen  Post-op Assessment: Report given to RN and Post -op Vital signs reviewed and stable  Post vital signs: Reviewed and stable  Last Vitals:  Vitals:   01/28/18 0134 01/28/18 0647  BP: 121/81 125/75  Pulse: 65 60  Resp:  16  Temp: 37.3 C 36.7 C  SpO2: 100% 100%    Last Pain:  Vitals:   01/28/18 0647  TempSrc: Oral  PainSc:          Complications: No apparent anesthesia complications

## 2018-01-28 NOTE — Op Note (Signed)
Laparoscopic Cholecystectomy with IOC Procedure Note  Indications: This patient presents with acute cholecystitis and will undergo laparoscopic cholecystectomy.  Pre-operative Diagnosis: Calculus of gallbladder with acute cholecystitis, without mention of obstruction  Post-operative Diagnosis: Same  Surgeon: Wynona LunaMatthew K Sharry Beining   Assistants: none  Anesthesia: General endotracheal anesthesia  ASA Class: 2  Procedure Details  The patient was seen again in the Holding Room. The risks, benefits, complications, treatment options, and expected outcomes were discussed with the patient. The possibilities of reaction to medication, pulmonary aspiration, perforation of viscus, bleeding, recurrent infection, finding a normal gallbladder, the need for additional procedures, failure to diagnose a condition, the possible need to convert to an open procedure, and creating a complication requiring transfusion or operation were discussed with the patient. The likelihood of improving the patient's symptoms with return to their baseline status is good.  The patient and/or family concurred with the proposed plan, giving informed consent. The site of surgery properly noted. The patient was taken to Operating Room, identified as Kristine Nunez and the procedure verified as Laparoscopic Cholecystectomy with Intraoperative Cholangiogram. A Time Out was held and the above information confirmed.  Prior to the induction of general anesthesia, antibiotic prophylaxis was administered. General endotracheal anesthesia was then administered and tolerated well. After the induction, the abdomen was prepped with Chloraprep and draped in the sterile fashion. The patient was positioned in the supine position.  Local anesthetic agent was injected into the skin near the umbilicus and an incision made. We dissected down to the abdominal fascia with blunt dissection.  The patient has a preexisting umbilical hernia defect about 1 cm in  diameter. We entered the peritoneal cavity bluntly.  A pursestring suture of 0-Vicryl was placed around the fascial opening.  The Hasson cannula was inserted and secured with the stay suture.  Pneumoperitoneum was then created with CO2 and tolerated well without any adverse changes in the patient's vital signs. An 11-mm port was placed in the subxiphoid position.  Two 5-mm ports were placed in the right upper quadrant. All skin incisions were infiltrated with a local anesthetic agent before making the incision and placing the trocars.   We positioned the patient in reverse Trendelenburg, tilted slightly to the patient's left.  The gallbladder was identified, the fundus grasped and retracted cephalad. The gallbladder is quite edematous and thickened.  Adhesions were lysed bluntly and with the electrocautery where indicated, taking care not to injure any adjacent organs or viscus. The infundibulum was grasped and retracted laterally, exposing the peritoneum overlying the triangle of Calot. This was then divided and exposed in a blunt fashion. A critical view of the cystic duct and cystic artery was obtained.  The cystic duct was clearly identified and bluntly dissected circumferentially. The cystic duct was ligated with a clip distally.   An incision was made in the cystic duct and the [facility] catheter introduced. The catheter was secured using a clip. A cholangiogram was then obtained which showed good visualization of the distal and proximal biliary tree with no sign of filling defects or obstruction.  Contrast flowed easily into the duodenum. The catheter was then removed.   The cystic duct was then ligated with clips and divided. The cystic artery was identified, dissected free, ligated with clips and divided as well.   The gallbladder was dissected from the liver bed in retrograde fashion with the electrocautery. The gallbladder was removed and placed in an Endocatch sac. The liver bed was  irrigated and inspected. Hemostasis was  achieved with the electrocautery and Surgicel SNOW. Copious irrigation was utilized and was repeatedly aspirated until clear.  The gallbladder and Eco sac were then removed through the umbilical port site.  The 0 Vicryl suture was used to close the umbilical fascia.    We again inspected the right upper quadrant for hemostasis.  Pneumoperitoneum was released as we removed the trocars.  4-0 Monocryl was used to close the skin.   Benzoin, steri-strips, and clean dressings were applied. The patient was then extubated and brought to the recovery room in stable condition. Instrument, sponge, and needle counts were correct at closure and at the conclusion of the case.   Findings: Cholecystitis with Cholelithiasis  Estimated Blood Loss: less than 50 mL         Drains: none         Specimens: Gallbladder           Complications: None; patient tolerated the procedure well.         Disposition: PACU - hemodynamically stable.         Condition: stable  Wilmon Arms. Corliss Skains, MD, [facility]  General/ Trauma Surgery  01/28/2018 11:26 AM

## 2018-01-28 NOTE — Progress Notes (Signed)
Patient is leaving unit to go to surgery. Will assess patient when she returns to the unit

## 2018-01-28 NOTE — Anesthesia Procedure Notes (Signed)
Procedure Name: Intubation Date/Time: 01/28/2018 7:30 AM Performed by: Wilburn Cornelia, CRNA Pre-anesthesia Checklist: Patient identified, Emergency Drugs available, Suction available, Patient being monitored and Timeout performed Patient Re-evaluated:Patient Re-evaluated prior to induction Oxygen Delivery Method: Circle system utilized Preoxygenation: Pre-oxygenation with 100% oxygen Induction Type: IV induction Ventilation: Mask ventilation without difficulty Laryngoscope Size: Mac and 3 Grade View: Grade I Tube type: Oral Tube size: 7.0 mm Number of attempts: 1 Airway Equipment and Method: Stylet Placement Confirmation: ETT inserted through vocal cords under direct vision,  positive ETCO2,  CO2 detector and breath sounds checked- equal and bilateral Secured at: 21 cm Tube secured with: Tape Dental Injury: Teeth and Oropharynx as per pre-operative assessment

## 2018-01-28 NOTE — Anesthesia Postprocedure Evaluation (Signed)
Anesthesia Post Note  Patient: Kristine Nunez  Procedure(s) Performed: LAPAROSCOPIC CHOLECYSTECTOMY WITH INTRAOPERATIVE CHOLANGIOGRAM (N/A Abdomen)     Patient location during evaluation: PACU Anesthesia Type: General Level of consciousness: awake Pain management: pain level controlled Vital Signs Assessment: post-procedure vital signs reviewed and stable Cardiovascular status: stable Anesthetic complications: no    Last Vitals:  Vitals:   01/28/18 1203 01/28/18 1243  BP: 119/81 113/80  Pulse: 67 67  Resp: 19 17  Temp:  37.1 C  SpO2: 92% 98%    Last Pain:  Vitals:   01/28/18 1318  TempSrc:   PainSc: 7                  Estel Tonelli

## 2018-01-28 NOTE — Anesthesia Preprocedure Evaluation (Addendum)
Anesthesia Evaluation  Patient identified by MRN, date of birth, ID band Patient awake    Reviewed: Allergy & Precautions, NPO status , Patient's Chart, lab work & pertinent test results  History of Anesthesia Complications (+) Emergence Delirium  Airway Mallampati: II  TM Distance: >3 FB     Dental   Pulmonary    breath sounds clear to auscultation       Cardiovascular negative cardio ROS   Rhythm:Regular Rate:Normal     Neuro/Psych    GI/Hepatic Neg liver ROS, History noted CG   Endo/Other  negative endocrine ROS  Renal/GU negative Renal ROS     Musculoskeletal   Abdominal   Peds  Hematology   Anesthesia Other Findings   Reproductive/Obstetrics                            Anesthesia Physical Anesthesia Plan  ASA: II  Anesthesia Plan: General   Post-op Pain Management:    Induction: Intravenous  PONV Risk Score and Plan: Dexamethasone, Midazolam and Ondansetron  Airway Management Planned: Oral ETT  Additional Equipment:   Intra-op Plan:   Post-operative Plan: Extubation in OR  Informed Consent: I have reviewed the patients History and Physical, chart, labs and discussed the procedure including the risks, benefits and alternatives for the proposed anesthesia with the patient or authorized representative who has indicated his/her understanding and acceptance.   Dental advisory given  Plan Discussed with: CRNA and Anesthesiologist  Anesthesia Plan Comments:         Anesthesia Quick Evaluation

## 2018-01-28 NOTE — Interval H&P Note (Signed)
History and Physical Interval Note:  01/28/2018 9:01 AM  Kristine Nunez  has presented today for surgery, with the diagnosis of Acute Cholecystitis  The various methods of treatment have been discussed with the patient and family. After consideration of risks, benefits and other options for treatment, the patient has consented to  Procedure(s): LAPAROSCOPIC CHOLECYSTECTOMY WITH INTRAOPERATIVE CHOLANGIOGRAM (N/A) as a surgical intervention .  The patient's history has been reviewed, patient examined, no change in status, stable for surgery.  I have reviewed the patient's chart and labs.  Questions were answered to the patient's satisfaction.     Wynona LunaMatthew K Ahmadou Bolz

## 2018-01-29 ENCOUNTER — Encounter (HOSPITAL_COMMUNITY): Payer: Self-pay | Admitting: Surgery

## 2018-01-29 LAB — COMPREHENSIVE METABOLIC PANEL WITH GFR
ALT: 80 U/L — ABNORMAL HIGH (ref 14–54)
AST: 76 U/L — ABNORMAL HIGH (ref 15–41)
Albumin: 2.6 g/dL — ABNORMAL LOW (ref 3.5–5.0)
Alkaline Phosphatase: 65 U/L (ref 38–126)
Anion gap: 6 (ref 5–15)
BUN: <5 mg/dL — ABNORMAL LOW (ref 6–20)
CO2: 22 mmol/L (ref 22–32)
Calcium: 7.5 mg/dL — ABNORMAL LOW (ref 8.9–10.3)
Chloride: 109 mmol/L (ref 101–111)
Creatinine, Ser: 0.68 mg/dL (ref 0.44–1.00)
GFR calc Af Amer: >60 mL/min
GFR calc non Af Amer: >60 mL/min
Glucose, Bld: 130 mg/dL — ABNORMAL HIGH (ref 65–99)
Potassium: 3.8 mmol/L (ref 3.5–5.1)
Sodium: 137 mmol/L (ref 135–145)
Total Bilirubin: 0.6 mg/dL (ref 0.3–1.2)
Total Protein: 5.6 g/dL — ABNORMAL LOW (ref 6.5–8.1)

## 2018-01-29 LAB — CBC
HEMATOCRIT: 33.8 % — AB (ref 36.0–46.0)
HEMOGLOBIN: 11.2 g/dL — AB (ref 12.0–15.0)
MCH: 30.7 pg (ref 26.0–34.0)
MCHC: 33.1 g/dL (ref 30.0–36.0)
MCV: 92.6 fL (ref 78.0–100.0)
Platelets: 241 10*3/uL (ref 150–400)
RBC: 3.65 MIL/uL — AB (ref 3.87–5.11)
RDW: 13.2 % (ref 11.5–15.5)
WBC: 7 10*3/uL (ref 4.0–10.5)

## 2018-01-29 MED ORDER — OXYCODONE HCL 5 MG PO TABS: 5.0000 mg | ORAL_TABLET | Freq: Four times a day (QID) | ORAL | 0 refills | Status: DC | PRN

## 2018-01-29 MED ORDER — ACETAMINOPHEN 500 MG PO TABS: 500.0000 mg | ORAL_TABLET | Freq: Four times a day (QID) | ORAL | 0 refills | Status: DC | PRN

## 2018-01-29 NOTE — Discharge Instructions (Signed)
Please arrive at least 30 min before your appointment to complete your check in paperwork.  If you are unable to arrive 30 min prior to your appointment time we may have to cancel or reschedule you. ° °LAPAROSCOPIC SURGERY: POST OP INSTRUCTIONS  °1. DIET: Follow a light bland diet the first 24 hours after arrival home, such as soup, liquids, crackers, etc. Be sure to include lots of fluids daily. Avoid fast food or heavy meals as your are more likely to get nauseated. Eat a low fat the next few days after surgery.  °2. Take your usually prescribed home medications unless otherwise directed. °3. PAIN CONTROL:  °1. Pain is best controlled by a usual combination of three different methods TOGETHER:  °1. Ice/Heat °2. Over the counter pain medication °3. Prescription pain medication °2. Most patients will experience some swelling and bruising around the incisions. Ice packs or heating pads (30-60 minutes up to 6 times a day) will help. Use ice for the first few days to help decrease swelling and bruising, then switch to heat to help relax tight/sore spots and speed recovery. Some people prefer to use ice alone, heat alone, alternating between ice & heat. Experiment to what works for you. Swelling and bruising can take several weeks to resolve.  °3. It is helpful to take an over-the-counter pain medication regularly for the first few weeks. Choose one of the following that works best for you:  °1. Naproxen (Aleve, etc) Two 220mg tabs twice a day °2. Ibuprofen (Advil, etc) Three 200mg tabs four times a day (every meal & bedtime) °3. Acetaminophen (Tylenol, etc) 500-650mg four times a day (every meal & bedtime) °4. A prescription for pain medication (such as oxycodone, hydrocodone, etc) should be given to you upon discharge. Take your pain medication as prescribed.  °1. If you are having problems/concerns with the prescription medicine (does not control pain, nausea, vomiting, rash, itching, etc), please call us [phone] to see if we need to switch you to a different pain medicine that will work better for you and/or control your side effect better. °2. If you need a refill on your pain medication, please contact your pharmacy. They will contact our office to request authorization. Prescriptions will not be filled after 5 pm or on week-ends. °4. Avoid getting constipated. Between the surgery and the pain medications, it is common to experience some constipation. Increasing fluid intake and taking a fiber supplement (such as Metamucil, Citrucel, FiberCon, MiraLax, etc) 1-2 times a day regularly will usually help prevent this problem from occurring. A mild laxative (prune juice, Milk of Magnesia, MiraLax, etc) should be taken according to package directions if there are no bowel movements after 48 hours.  °5. Watch out for diarrhea. If you have many loose bowel movements, simplify your diet to bland foods & liquids for a few days. Stop any stool softeners and decrease your fiber supplement. Switching to mild anti-diarrheal medications (Kayopectate, Pepto Bismol) can help. If this worsens or does not improve, please call us. °6. Wash / shower every day. You may shower over the dressings as they are waterproof. Continue to shower over incision(s) after the dressing is off. °7. Remove your waterproof bandages 5 days after surgery. You may leave the incision open to air. You may replace a dressing/Band-Aid to cover the incision for comfort if you wish.  °8. ACTIVITIES as tolerated:  °1. You may resume regular (light) daily activities beginning the next day--such as daily self-care, walking, climbing stairs--gradually   increasing activities as tolerated. If you can walk 30 minutes without difficulty, it is safe to try more intense activity such as jogging, treadmill, bicycling, low-impact aerobics, swimming, etc. 2. Save the most intensive and strenuous activity for last such as sit-ups, heavy lifting, contact sports, etc Refrain  from any heavy lifting or straining until you are off narcotics for pain control.  3. DO NOT PUSH THROUGH PAIN. Let pain be your guide: If it hurts to do something, don't do it. Pain is your body warning you to avoid that activity for another week until the pain goes down. 4. You may drive when you are no longer taking prescription pain medication, you can comfortably wear a seatbelt, and you can safely maneuver your car and apply brakes. 5. You may have sexual intercourse when it is comfortable.  9. FOLLOW UP in our office  1. Please call [facility] at [phone] to set up an appointment to see your surgeon in the office for a follow-up appointment approximately 2-3 weeks after your surgery. 2. Make sure that you call for this appointment the day you arrive home to insure a convenient appointment time.      10. IF YOU HAVE DISABILITY OR FAMILY LEAVE FORMS, BRING THEM TO THE               OFFICE FOR PROCESSING.   WHEN TO CALL us [phone]:  1. Poor pain control 2. Reactions / problems with new medications (rash/itching, nausea, etc)  3. Fever over 101.5 F (38.5 C) 4. Inability to urinate 5. Nausea and/or vomiting 6. Worsening swelling or bruising 7. Continued bleeding from incision. 8. Increased pain, redness, or drainage from the incision  The clinic staff is available to answer your questions during regular business hours (8:30am-5pm). Please dont hesitate to call and ask to speak to one of our nurses for clinical concerns.  If you have a medical emergency, go to the nearest emergency room or call 911.  A surgeon from [facility] is always on call at the [facility], [place]  [address], [place], [place] [address] ?  MAIN: [phone] ? TOLL FREE: [phone] ?  FAX [phone]  [email or web address]    Cholecystitis Cholecystitis is swelling and irritation (inflammation) of the gallbladder. The gallbladder is  an organ that is shaped like a pear. It is under the liver on the right side of the body. This condition is often caused by gallstones. You doctor may do tests to see how your gallbladder works. These tests may include:  Imaging tests, such as: ? An ultrasound. ? MRI.  Tests that check how your liver works.  This condition needs treatment. Follow these instructions at home: Home care will depend on your treatment. In general:  Take over-the-counter and prescription medicines only as told by your doctor.  If you were prescribed an antibiotic medicine, take it as told by your doctor. Do not stop taking the antibiotic even if you start to feel better.  Follow instructions from your doctor about what to eat or drink. When you are allowed to eat, avoid eating or drinking anything that causes your symptoms to start.  Keep all follow-up visits as told by your doctor. This is important.  Contact a doctor if:  You have pain and your medicine does not help.  You have a fever. Get help right away if:  Your pain moves to: ? Another part of your belly (abdomen). ?  Your back.  Your symptoms do not go away.  You have new symptoms. This information is not intended to replace advice given to you by your health care provider. Make sure you discuss any questions you have with your health care provider. Document Released: 10/25/2011 Document Revised: 04/12/2016 Document Reviewed: 02/16/2015 Elsevier Interactive Patient Education  2018 Else[employer]  Incision Care, Adult An incision is a surgical cut that is made through your skin. Most incisions are closed after surgery. Your incision may be closed with stitches (sutures), staples, skin glue, or adhesive strips. You may need to return to your health care provider to have sutures or staples removed. This may occur several days to several weeks after your surgery. The incision needs to be cared for properly to prevent infection. How to care for  your incision Incision care   Follow instructions from your health care provider about how to take care of your incision. Make sure you: ? Wash your hands with soap and water before you change the bandage (dressing). If soap and water are not available, use hand sanitizer. ? Change your dressing as told by your health care provider. ? Leave sutures, skin glue, or adhesive strips in place. These skin closures may need to stay in place for 2 weeks or longer. If adhesive strip edges start to loosen and curl up, you may trim the loose edges. Do not remove adhesive strips completely unless your health care provider tells you to do that.  Check your incision area every day for signs of infection. Check for: ? More redness, swelling, or pain. ? More fluid or blood. ? Warmth. ? Pus or a bad smell.  Ask your health care provider how to clean the incision. This may include: ? Using mild soap and water. ? Using a clean towel to pat the incision dry after cleaning it. ? Applying a cream or ointment. Do this only as told by your health care provider. ? Covering the incision with a clean dressing.  Ask your health care provider when you can leave the incision uncovered.  Do not take baths, swim, or use a hot tub until your health care provider approves. Ask your health care provider if you can take showers. You may only be allowed to take sponge baths for bathing. Medicines  If you were prescribed an antibiotic medicine, cream, or ointment, take or apply the antibiotic as told by your health care provider. Do not stop taking or applying the antibiotic even if your condition improves.  Take over-the-counter and prescription medicines only as told by your health care provider. General instructions  Limit movement around your incision to improve healing. ? Avoid straining, lifting, or exercise for the first month, or for as long as told by your health care provider. ? Follow instructions from your  health care provider about returning to your normal activities. ? Ask your health care provider what activities are safe.  Protect your incision from the sun when you are outside for the first 6 months, or for as long as told by your health care provider. Apply sunscreen around the scar or cover it up.  Keep all follow-up visits as told by your health care provider. This is important. Contact a health care provider if:  Your have more redness, swelling, or pain around the incision.  You have more fluid or blood coming from the incision.  Your incision feels warm to the touch.  You have pus or a bad smell coming from the  incision.  You have a fever or shaking chills.  You are nauseous or you vomit.  You are dizzy.  Your sutures or staples come undone. Get help right away if:  You have a red streak coming from your incision.  Your incision bleeds through the dressing and the bleeding does not stop with gentle pressure.  The edges of your incision open up and separate.  You have severe pain.  You have a rash.  You are confused.  You faint.  You have trouble breathing and a fast heartbeat. This information is not intended to replace advice given to you by your health care provider. Make sure you discuss any questions you have with your health care provider. Document Released: 05/25/2005 Document Revised: 07/13/2016 Document Reviewed: 05/23/2016 Elsevier Interactive Patient Education  [employer].

## 2018-01-29 NOTE — Discharge Summary (Signed)
[facility] Discharge Summary   Patient ID: Kristine Nunez MRN: [id number] DOB/AGE: 29-26-1992 29 y.o.  Admit date: 01/27/2018 Discharge date: 01/29/2018  Admitting Diagnosis: Acute cholecystitis   Discharge Diagnosis Patient Active Problem List   Diagnosis Date Noted  . Acute cholecystitis 01/27/2018    Consultants None  Imaging: Dg Cholangiogram Operative  Result Date: 01/28/2018 CLINICAL DATA:  Intraoperative cholangiogram during laparoscopic cholecystectomy. EXAM: INTRAOPERATIVE CHOLANGIOGRAM FLUOROSCOPY TIME:  14 seconds COMPARISON:  Abdominal ultrasound - 01/27/2018 FINDINGS: Intraoperative cholangiographic images of the right upper abdominal quadrant during laparoscopic cholecystectomy are provided for review. Surgical clips overlie the expected location of the gallbladder fossa. Contrast injection demonstrates selective cannulation of the central aspect of the cystic duct. There is passage of contrast through the central aspect of the cystic duct with filling of a non dilated common bile duct. There is passage of contrast though the CBD and into the descending portion of the duodenum. There is minimal reflux of injected contrast into the common hepatic duct and central aspect of the non dilated intrahepatic biliary system. There is minimal opacification of the central aspect of the pancreatic duct which appears nondilated. There are no discrete filling defects within the opacified portions of the biliary system to suggest the presence of choledocholithiasis. IMPRESSION: No evidence of choledocholithiasis. Electronically Signed   By: Simonne Come M.D.   On: 01/28/2018 10:28   US Abdomen Complete  Result Date: 01/27/2018 CLINICAL DATA:  Abdominal pain EXAM: ABDOMEN ULTRASOUND COMPLETE COMPARISON:  11/17/2016 abdominal sonogram and CT abdomen/pelvis. FINDINGS: Gallbladder: Gallbladder is completely filled with shadowing gallstones. Diffuse moderate gallbladder wall  thickening. Sonographic Eulah Pont sign is present. No pericholecystic fluid. Common bile duct: Diameter: 3 mm Liver: No focal lesion identified. Within normal limits in parenchymal echogenicity. Portal vein is patent on color Doppler imaging with normal direction of blood flow towards the liver. IVC: No abnormality visualized. Pancreas: Visualized portion unremarkable. Spleen: Size and appearance within normal limits. Right Kidney: Length: 7.9 cm. Echogenicity within normal limits. No mass or hydronephrosis visualized. Left Kidney: Length: 8.6 cm. Echogenicity within normal limits. No mass or hydronephrosis visualized. Abdominal aorta: No aneurysm visualized. Other findings: None. IMPRESSION: 1. Cholelithiasis. Moderate diffuse gallbladder wall thickening. Sonographic Murphy sign. Findings are compatible with acute cholecystitis. 2. No biliary ductal dilatation. 3. Otherwise normal abdominal sonogram. Electronically Signed   By: Delbert Phenix M.D.   On: 01/27/2018 18:45    Procedures Dr. Corliss Skains (01/28/18) - Laparoscopic Cholecystectomy with [facility]  Hospital Course:  Patient is a 27 year old female who presented to [facility] with abdominal pain.  Workup showed acute cholecystitis.  Patient was admitted and underwent procedure listed above.  Tolerated procedure well and was transferred to the floor.  Diet was advanced as tolerated.  On POD#1, the patient was voiding well, tolerating diet, ambulating well, pain well controlled, vital signs stable, incisions c/d/i and felt stable for discharge home.  Patient will follow up in our office in 2 weeks and knows to call with questions or concerns. She will call to confirm appointment date/time.   Patient speaks only swahili a family member assisted with translation and she was advised to bring someone who can help translate to her follow up appointment.   Physical Exam: General:  Alert, NAD, pleasant, comfortable Abd:  Soft, ND, mild tenderness, incisions C/D/I  Allergies  as of 01/29/2018   No Known Allergies     Medication List    TAKE these medications   acetaminophen 500 MG tablet Commonly  known as:  TYLENOL Take 1 tablet (500 mg total) by mouth every 6 (six) hours as needed for mild pain or headache.   oxyCODONE 5 MG immediate release tablet Commonly known as:  Oxy IR/ROXICODONE Take 1 tablet (5 mg total) by mouth every 6 (six) hours as needed for moderate pain or severe pain.        Follow-up Information    [facility]/2018.   Specialty:  General Surgery Why:  Your appointment is at 8:45 AM. Bring photo ID and insurance information. Please arrive 30 min prior to appointment time.  Contact information: [address] [place] [address] [phone]           Signed: Wells GuilesKelly Rayburn, [facility] 01/29/2018, 11:25 AM Pager: [phone] Consults: [phone] Mon-Fri 7:00 am-4:30 pm Sat-Sun 7:00 am-11:30 am

## 2018-02-02 ENCOUNTER — Emergency Department (HOSPITAL_COMMUNITY)
Admission: EM | Admit: 2018-02-02 | Discharge: 2018-02-02 | Disposition: A | Discharge status: Home / Self Care | Payer: BLUE CROSS/BLUE SHIELD | Attending: Emergency Medicine | Admitting: Emergency Medicine

## 2018-02-02 ENCOUNTER — Encounter (HOSPITAL_COMMUNITY): Payer: Self-pay | Admitting: Internal Medicine

## 2018-02-02 ENCOUNTER — Emergency Department (HOSPITAL_COMMUNITY): Payer: BLUE CROSS/BLUE SHIELD

## 2018-02-02 DIAGNOSIS — R110 Nausea: Secondary | ICD-10-CM | POA: Diagnosis not present

## 2018-02-02 DIAGNOSIS — G8918 Other acute postprocedural pain: Secondary | ICD-10-CM | POA: Diagnosis not present

## 2018-02-02 DIAGNOSIS — Z9049 Acquired absence of other specified parts of digestive tract: Secondary | ICD-10-CM | POA: Insufficient documentation

## 2018-02-02 DIAGNOSIS — R1084 Generalized abdominal pain: Secondary | ICD-10-CM

## 2018-02-02 DIAGNOSIS — R1030 Lower abdominal pain, unspecified: Secondary | ICD-10-CM | POA: Diagnosis present

## 2018-02-02 LAB — CBC
HCT: 38.8 % (ref 36.0–46.0)
Hemoglobin: 12.6 g/dL (ref 12.0–15.0)
MCH: 30.4 pg (ref 26.0–34.0)
MCHC: 32.5 g/dL (ref 30.0–36.0)
MCV: 93.5 fL (ref 78.0–100.0)
Platelets: 316 10*3/uL (ref 150–400)
RBC: 4.15 MIL/uL (ref 3.87–5.11)
RDW: 13.1 % (ref 11.5–15.5)
WBC: 6.9 10*3/uL (ref 4.0–10.5)

## 2018-02-02 LAB — I-STAT TROPONIN, ED: Troponin i, poc: 0 ng/mL (ref 0.00–0.08)

## 2018-02-02 LAB — COMPREHENSIVE METABOLIC PANEL
ALBUMIN: 3.1 g/dL — AB (ref 3.5–5.0)
ALT: 62 U/L — ABNORMAL HIGH (ref 14–54)
ANION GAP: 9 (ref 5–15)
AST: 47 U/L — AB (ref 15–41)
Alkaline Phosphatase: 81 U/L (ref 38–126)
BUN: 6 mg/dL (ref 6–20)
CHLORIDE: 105 mmol/L (ref 101–111)
CO2: 24 mmol/L (ref 22–32)
Calcium: 8 mg/dL — ABNORMAL LOW (ref 8.9–10.3)
Creatinine, Ser: 0.73 mg/dL (ref 0.44–1.00)
GFR calc Af Amer: >60 mL/min (ref >60)
GFR calc non Af Amer: >60 mL/min (ref >60)
GLUCOSE: 100 mg/dL — AB (ref 65–99)
POTASSIUM: 3.8 mmol/L (ref 3.5–5.1)
SODIUM: 138 mmol/L (ref 135–145)
TOTAL PROTEIN: 6.5 g/dL (ref 6.5–8.1)
Total Bilirubin: 0.4 mg/dL (ref 0.3–1.2)

## 2018-02-02 LAB — BRAIN NATRIURETIC PEPTIDE: B Natriuretic Peptide: 25.3 pg/mL (ref 0.0–100.0)

## 2018-02-02 LAB — LIPASE, BLOOD: Lipase: 21 U/L (ref 11–51)

## 2018-02-02 MED ORDER — POLYETHYLENE GLYCOL 3350 17 GM/SCOOP PO POWD: 1.0000 | Freq: Once | ORAL | 0 refills | Status: AC

## 2018-02-02 MED ORDER — OXYCODONE HCL 5 MG PO TABS: 5.0000 mg | ORAL_TABLET | Freq: Four times a day (QID) | ORAL | 0 refills | Status: DC | PRN

## 2018-02-02 MED ORDER — SODIUM CHLORIDE 0.9 % IJ SOLN
INTRAMUSCULAR | Status: AC
  Filled 2018-02-02: qty 50

## 2018-02-02 MED ORDER — KETOROLAC TROMETHAMINE 15 MG/ML IJ SOLN
15.0000 mg | Freq: Once | INTRAMUSCULAR | Status: AC
  Administered 2018-02-02: 15 mg via INTRAVENOUS
  Filled 2018-02-02: qty 1

## 2018-02-02 MED ORDER — MORPHINE SULFATE (PF) 4 MG/ML IV SOLN
4.0000 mg | Freq: Once | INTRAVENOUS | Status: AC
  Administered 2018-02-02: 4 mg via INTRAVENOUS
  Filled 2018-02-02: qty 1

## 2018-02-02 MED ORDER — MAGNESIUM CITRATE PO SOLN
1.0000 | Freq: Once | ORAL | Status: AC
  Administered 2018-02-02: 1 via ORAL
  Filled 2018-02-02: qty 296

## 2018-02-02 MED ORDER — IOPAMIDOL (ISOVUE-300) INJECTION 61%
INTRAVENOUS | Status: AC
  Administered 2018-02-02: 100 mL
  Filled 2018-02-02: qty 100

## 2018-02-02 MED ORDER — MORPHINE SULFATE (PF) 2 MG/ML IV SOLN
2.0000 mg | Freq: Once | INTRAVENOUS | Status: AC
  Administered 2018-02-02: 2 mg via INTRAVENOUS
  Filled 2018-02-02: qty 1

## 2018-02-02 MED ORDER — ONDANSETRON HCL 4 MG/2ML IJ SOLN
4.0000 mg | Freq: Once | INTRAMUSCULAR | Status: AC
  Administered 2018-02-02: 4 mg via INTRAVENOUS
  Filled 2018-02-02: qty 2

## 2018-02-02 MED ORDER — DOCUSATE SODIUM 100 MG PO CAPS: 100.0000 mg | ORAL_CAPSULE | Freq: Two times a day (BID) | ORAL | 0 refills | Status: DC

## 2018-02-02 NOTE — ED Notes (Signed)
Bed: WA10 Expected date:  Expected time:  Means of arrival:  Comments: EMS 

## 2018-02-02 NOTE — ED Notes (Signed)
Waiting for respiratory to bring incentive spirometer then can discharge patient.

## 2018-02-02 NOTE — ED Provider Notes (Signed)
[facility]-EMERGENCY DEPT Provider Note   CSN: [id number] Arrival date & time: 02/02/18  1022     History   Chief Complaint Chief Complaint  Patient presents with  . Abdominal Pain  . Nausea    HPI Kristine Nunez is a 27 y.o. female presenting for evaluation of abdominal pain.  Patient speaks Swahili, patient's neighbor assisted in translation.  Patient states she had her gallbladder removed on March 12.  She has had mild intermittent abdominal pain since, with worsening today.  Her abdominal pain moves, mostly between the left side and suprapubic area.  However, she also reports abdominal pain in the right upper quadrant that radiates up into the R side of her chest and to her back.  Abdominal pain is constant and severe.  It is not improved with her at home pain medications.  She has associated nausea without vomiting.  She reports she has not had a bowel movement since prior to the surgery.  She is not taking anything for bowel movements.  She has no other medical problems, does not take medications daily.  She denies fevers, chills, cough, shortness of breath, or abnormal urination.  She denies leg pain or swelling.   HPI  History reviewed. No pertinent past medical history.  Patient Active Problem List   Diagnosis Date Noted  . Acute cholecystitis 01/27/2018    Past Surgical History:  Procedure Laterality Date  . CESAREAN SECTION     x 3  . CHOLECYSTECTOMY N/A 01/28/2018   Procedure: LAPAROSCOPIC CHOLECYSTECTOMY WITH INTRAOPERATIVE CHOLANGIOGRAM;  Surgeon: Manus Rudd, MD;  Location: [facility] OR;  Service: General;  Laterality: N/A;  . CHOLECYSTECTOMY  01/28/2018    OB History    No data available       Home Medications    Prior to Admission medications   Medication Sig Start Date End Date Taking? Authorizing Provider  acetaminophen (TYLENOL) 500 MG tablet Take 1 tablet (500 mg total) by mouth every 6 (six) hours as needed for mild pain or  headache. 01/29/18  Yes Rayburn, Tresa Endo A, PA-C  docusate sodium (COLACE) 100 MG capsule Take 1 capsule (100 mg total) by mouth every 12 (twelve) hours. 02/02/18   Vannary Greening, PA-C  oxyCODONE (OXY IR/ROXICODONE) 5 MG immediate release tablet Take 1 tablet (5 mg total) by mouth every 6 (six) hours as needed for moderate pain or severe pain. 02/02/18   Alaira Level, PA-C    Family History No family history on file.  Social History Social History   Tobacco Use  . Smoking status: Never Smoker  . Smokeless tobacco: Never Used  Substance Use Topics  . Alcohol use: No  . Drug use: No     Allergies   Patient has no known allergies.   Review of Systems Review of Systems  Gastrointestinal: Positive for abdominal distention, abdominal pain, constipation and nausea.  All other systems reviewed and are negative.    Physical Exam Updated Vital Signs BP (!) 136/98   Pulse (!) 104   Temp 98 F (36.7 C) (Oral)   Resp 18   LMP 01/27/2018 (Within Days) Comment: Neg preg test 01/27/18  SpO2 94%   Physical Exam  Constitutional: She is oriented to person, place, and time. She appears well-developed and well-nourished. No distress.  HENT:  Head: Normocephalic and atraumatic.  Eyes: EOM are normal. Pupils are equal, round, and reactive to light.  Neck: Normal range of motion.  Cardiovascular: Normal rate, regular rhythm and intact distal pulses.  Pulmonary/Chest: Effort normal and breath sounds normal. No respiratory distress. She has no wheezes.  Difficult to hear lung sounds due to body habitus and patient not taking deep breaths.  No obvious shortness of breath or respiratory distress.  Speaking in full sentences without difficulty.  Abdominal: Soft. Bowel sounds are normal. She exhibits no distension and no mass. There is tenderness. There is no guarding.  Generalized discomfort with palpation of the abdomen with increased tenderness of RUQ, and mid-left side.    Musculoskeletal: Normal range of motion.  Neurological: She is alert and oriented to person, place, and time.  Skin: Skin is warm and dry.  Psychiatric: She has a normal mood and affect.  Nursing note and vitals reviewed.    ED Treatments / Results  Labs (all labs ordered are listed, but only abnormal results are displayed) Labs Reviewed  COMPREHENSIVE METABOLIC PANEL - Abnormal; Notable for the following components:      Result Value   Glucose, Bld 100 (*)    Calcium 8.0 (*)    Albumin 3.1 (*)    AST 47 (*)    ALT 62 (*)    All other components within normal limits  CBC  LIPASE, BLOOD  BRAIN NATRIURETIC PEPTIDE  I-STAT TROPONIN, ED    EKG  EKG Interpretation  Date/Time:  Sunday February 02 2018 15:13:53 EDT Ventricular Rate:  73 PR Interval:    QRS Duration: 86 QT Interval:  380 QTC Calculation: 419 R Axis:   29 Text Interpretation:  Sinus rhythm No previous ECGs available Confirmed by Frederick PeersLittle, Rachel [phone]([id number]) on 02/02/2018 3:18:40 PM       Radiology Dg Chest 2 View  Result Date: 02/02/2018 CLINICAL DATA:  Nausea and epigastric pain. EXAM: CHEST - 2 VIEW COMPARISON:  None. FINDINGS: Cardiomegaly. Low lung volumes. Bibasilar heterogeneous pulmonary opacities. Probable trace bilateral pleural effusions. Unremarkable osseous skeleton. Cholecystectomy clips. IMPRESSION: Cardiomegaly. Low lung volumes with basilar opacities favored to represent atelectasis. Small bilateral pleural effusions. Electronically Signed   By: Annia Beltrew  Davis M.D.   On: 02/02/2018 12:27   Ct Abdomen Pelvis W Contrast  Result Date: 02/02/2018 CLINICAL DATA:  27 year old female with abdominal pain and distention, nausea vomiting. Recent cholecystectomy, postoperative day 5. EXAM: CT ABDOMEN AND PELVIS WITH CONTRAST TECHNIQUE: Multidetector CT imaging of the abdomen and pelvis was performed using the standard protocol following bolus administration of intravenous contrast. CONTRAST:  100mL ISOVUE-300  IOPAMIDOL (ISOVUE-300) INJECTION 61% COMPARISON:  Intraoperative cholangiogram 01/28/2018 CT Abdomen and Pelvis 11/17/2016. FINDINGS: Lower chest: There is confluent bilateral lower lobe opacity. In the right lower lobe there are air bronchograms suggesting consolidation, although the affected parenchyma enhances more like atelectasis. Similar left lower lobe opacity. Trace bilateral layering pleural effusions. Minimal atelectasis in the lingula. Stable borderline to mild cardiomegaly.  No pericardial effusion. Hepatobiliary: Trace perihepatic fluid over both the dome and the inferior liver tip. Liver enhancement remains normal. Postoperative changes to the gallbladder fossa with surgical clips and small gallbladder shaped air and fluid collection encompassing 35 x 36 x 40 mm (AP by transverse by CC). Mild regional soft tissue stranding (series 2, image 31). No intra or extrahepatic biliary ductal dilatation is evident. Pancreas: Negative.  No main pancreatic ductal dilatation. Spleen: Negative. Adrenals/Urinary Tract: Normal adrenal glands. Both kidneys appear stable and within normal limits. No perinephric stranding or hydronephrosis. Negative course of both ureters. Urinary bladder is decompressed with a small volume of adjacent free fluid (sagittal image 69). Stomach/Bowel: Decompressed distal sigmoid and rectum.  The sigmoid colon is redundant tracking into the right upper quadrant, and the non dependent portion is mildly gas distended. The upstream and downstream sigmoid are decompressed. The left colon is negative and contains some oral contrast. The transverse colon is mildly redundant. The distal transverse colon is normal aside from retained stool. At the hepatic flexure the large bowel is in proximity to mild gallbladder fossa soft tissue inflammation, but otherwise appears normal. Redundant right colon.  Negative appendix.  Negative terminal ileum. No dilated small bowel. There is a small volume of free  fluid in the distal small bowel mesentery. The stomach is mildly to moderately distended with fluid. The duodenum is decompressed and within normal limits. No abdominal free air. Vascular/Lymphatic: Major arterial structures in the abdomen and pelvis are patent and appear normal. The portal venous system is patent. No lymphadenopathy. Reproductive: Negative aside from mild adjacent pelvic free fluid. Other: Mild postoperative changes to the ventral abdominal wall and several areas, including the umbilicus, with no postoperative abdominal wall fluid collection or adverse features identified. Small volume pelvic free fluid with simple fluid density (series 2, image 67). Musculoskeletal: Negative. IMPRESSION: 1. Recent cholecystectomy. 3.5 x 4 cm air and fluid collection in the gallbladder fossa is probably postoperative in nature, but there is mild to moderate regional soft tissue inflammation. Postoperative infection is difficult to exclude. There is trace associated perihepatic free fluid. No intra- or extrahepatic biliary ductal dilatation. 2. Small volume lower abdominal and pelvic free fluid with no rim enhancement or organization to suggest abscess. 3. Severe bilateral lower lobe atelectasis with trace bilateral pleural effusions. Developing lower lobe infection is difficult to exclude. Electronically Signed   By: Odessa Fleming M.D.   On: 02/02/2018 13:57    Procedures Procedures (including critical care time)  Medications Ordered in ED Medications  ondansetron (ZOFRAN) injection 4 mg (4 mg Intravenous Given 02/02/18 1154)  morphine 4 MG/ML injection 4 mg (4 mg Intravenous Given 02/02/18 1154)  iopamidol (ISOVUE-300) 61 % injection (100 mLs  Contrast Given 02/02/18 1258)  ketorolac (TORADOL) 15 MG/ML injection 15 mg (15 mg Intravenous Given 02/02/18 1320)  morphine 4 MG/ML injection 4 mg (4 mg Intravenous Given 02/02/18 1515)  magnesium citrate solution 1 Bottle (1 Bottle Oral Given 02/02/18 1540)  morphine 2  MG/ML injection 2 mg (2 mg Intravenous Given 02/02/18 1908)     Initial Impression / Assessment and Plan / ED Course  I have reviewed the triage vital signs and the nursing notes.  Pertinent labs & imaging results that were available during my care of the patient were reviewed by me and considered in my medical decision making (see chart for details).     Pt presenting for evaluation of abd pain and nausea s/p cholecystectomy on March 12.  Physical examination, patient is afebrile not tachycardic.  She appears nontoxic.  Patient reports right upper quadrant pain that radiates backwards and into her chest.  She denies shortness of breath, palpitations, or difficulty breathing.  She reports cough that began prior to surgery and has been improving.  No leg pain or swelling.  No other risk factors for DVT or PE except recent surgery.  Patient remained without chest pain, shortness of breath, or tachycardia throughout the ER visit.  Doubt PE.  Basic abdominal labs obtained, morphine and Zofran for symptom control.  CT abdomen pelvis ordered.  Patient reports improvement with medications.  Chest xray viewed and interpreted by me, shows low lung volumes and atelectasis.  Labs  reassuring, no leukocytosis.  Electrolytes stable.  Kidney function baseline.  CT abdomen shows air/fluid around the gallbladder fossa, cannot rule out infection.  She has bilateral atelectasis with pleural effusions, cannot rule out infection.  Consulted with Dr Daphine Deutscher general surgery, who states CT reading is as expected for postop day 5.  No concern for infection at this time considering patient's vital signs and labs.  Follow-up in office as needed.  NPO and refill of pain medication. Discussed with attending, Dr. Clarene Duke evaluated the patient. CT shows bilateral atelectasis and mild pleural effusions, cannot r/o PNA. However, pt remains afebrile and without a cough, doubt PNA. Sxs likely due to atelectasis 2/2 pain. BNP reassuring,  doubt CHF.   Discussed findings and plan with pt. Pt given mag citrate and tolerating PO in ER. Will d/c with miralax and colace to encourage BM. incentive spirometry for atelectasis and PNA prevention. At this time, pt appears safe for d/c. Return precautions given. PT states she understands and agrees to plan.   Final Clinical Impressions(s) / ED Diagnoses   Final diagnoses:  Postoperative generalized abdominal pain    ED Discharge Orders        Ordered    oxyCODONE (OXY IR/ROXICODONE) 5 MG immediate release tablet  Every 6 hours PRN     02/02/18 1704    docusate sodium (COLACE) 100 MG capsule  Every 12 hours     02/02/18 1704    polyethylene glycol powder (GLYCOLAX/MIRALAX) powder   Once     02/02/18 1734       Shirl Weir, PA-C 02/03/18 1254    Little, Ambrose Finland, MD 02/05/18 2051

## 2018-02-02 NOTE — Discharge Instructions (Signed)
Continue taking pain medication as prescribed.  Take colace and miralax until you are having a bowel movement every day. Eat liquids until you see the doctor tomorrow, do not eat solid foods. Use the incentive spriometer 10 times a day. Call the surgery office tomorrow at 9:15 for further evaluation and management if you are still having pain.  Return to the ER if you develop fevers, difficulty breathing, worsening pain, or any new or concerning symptoms.

## 2018-02-02 NOTE — ED Triage Notes (Addendum)
[facility] c/o epigastric pain and nausea after cholecystectomy performed on 01/28/18. Pt was [facility]18 and has been experiencing nausea ever since her d/c. This a.m. Patient began c/o worsening pain and nausea. Pt speaks swahili. Given 4mg  zofran IV and 100mcg fentanyl IV by EMS.

## 2018-02-04 ENCOUNTER — Other Ambulatory Visit: Payer: Self-pay | Admitting: Student

## 2018-02-04 ENCOUNTER — Encounter (HOSPITAL_COMMUNITY): Payer: Self-pay

## 2018-02-04 ENCOUNTER — Emergency Department (HOSPITAL_COMMUNITY): Payer: BLUE CROSS/BLUE SHIELD

## 2018-02-04 ENCOUNTER — Other Ambulatory Visit: Payer: Self-pay

## 2018-02-04 ENCOUNTER — Emergency Department (HOSPITAL_COMMUNITY)
Admission: EM | Admit: 2018-02-04 | Discharge: 2018-02-04 | Disposition: A | Discharge status: Home / Self Care | Payer: BLUE CROSS/BLUE SHIELD | Attending: Emergency Medicine | Admitting: Emergency Medicine

## 2018-02-04 DIAGNOSIS — R05 Cough: Secondary | ICD-10-CM | POA: Insufficient documentation

## 2018-02-04 DIAGNOSIS — K5900 Constipation, unspecified: Secondary | ICD-10-CM | POA: Insufficient documentation

## 2018-02-04 DIAGNOSIS — G8918 Other acute postprocedural pain: Secondary | ICD-10-CM

## 2018-02-04 DIAGNOSIS — N12 Tubulo-interstitial nephritis, not specified as acute or chronic: Secondary | ICD-10-CM

## 2018-02-04 DIAGNOSIS — R109 Unspecified abdominal pain: Secondary | ICD-10-CM

## 2018-02-04 DIAGNOSIS — R1030 Lower abdominal pain, unspecified: Secondary | ICD-10-CM | POA: Diagnosis present

## 2018-02-04 LAB — COMPREHENSIVE METABOLIC PANEL
ALBUMIN: 3 g/dL — AB (ref 3.5–5.0)
ALK PHOS: 177 U/L — AB (ref 38–126)
ALT: 55 U/L — ABNORMAL HIGH (ref 14–54)
ANION GAP: 10 (ref 5–15)
AST: 30 U/L (ref 15–41)
BILIRUBIN TOTAL: 2.7 mg/dL — AB (ref 0.3–1.2)
BUN: 8 mg/dL (ref 6–20)
CALCIUM: 8.9 mg/dL (ref 8.9–10.3)
CO2: 24 mmol/L (ref 22–32)
Chloride: 100 mmol/L — ABNORMAL LOW (ref 101–111)
Creatinine, Ser: 0.74 mg/dL (ref 0.44–1.00)
GFR calc Af Amer: >60 mL/min (ref >60)
Glucose, Bld: 141 mg/dL — ABNORMAL HIGH (ref 65–99)
Potassium: 4.2 mmol/L (ref 3.5–5.1)
Sodium: 134 mmol/L — ABNORMAL LOW (ref 135–145)
TOTAL PROTEIN: 7 g/dL (ref 6.5–8.1)

## 2018-02-04 LAB — CBC
HEMATOCRIT: 43.8 % (ref 36.0–46.0)
HEMOGLOBIN: 14.6 g/dL (ref 12.0–15.0)
MCH: 30.4 pg (ref 26.0–34.0)
MCHC: 33.3 g/dL (ref 30.0–36.0)
MCV: 91.3 fL (ref 78.0–100.0)
Platelets: 349 10*3/uL (ref 150–400)
RBC: 4.8 MIL/uL (ref 3.87–5.11)
RDW: 13.2 % (ref 11.5–15.5)
WBC: 13 10*3/uL — ABNORMAL HIGH (ref 4.0–10.5)

## 2018-02-04 LAB — URINALYSIS, ROUTINE W REFLEX MICROSCOPIC
Glucose, UA: NEGATIVE mg/dL
HGB URINE DIPSTICK: NEGATIVE
Ketones, ur: 20 mg/dL — AB
Leukocytes, UA: NEGATIVE
NITRITE: POSITIVE — AB
Protein, ur: 100 mg/dL — AB
pH: 5 (ref 5.0–8.0)

## 2018-02-04 LAB — LIPASE, BLOOD: Lipase: 24 U/L (ref 11–51)

## 2018-02-04 MED ORDER — LEVOFLOXACIN 500 MG PO TABS: 750.0000 mg | ORAL_TABLET | Freq: Every day | ORAL | 0 refills | Status: AC

## 2018-02-04 MED ORDER — SODIUM CHLORIDE 0.9 % IV SOLN
1.0000 g | Freq: Once | INTRAVENOUS | Status: AC
  Administered 2018-02-04: 1 g via INTRAVENOUS
  Filled 2018-02-04: qty 10

## 2018-02-04 MED ORDER — ACETAMINOPHEN 500 MG PO TABS
1000.0000 mg | ORAL_TABLET | Freq: Once | ORAL | Status: AC
  Administered 2018-02-04: 1000 mg via ORAL
  Filled 2018-02-04: qty 2

## 2018-02-04 MED ORDER — SODIUM CHLORIDE 0.9 % IV BOLUS (SEPSIS)
1000.0000 mL | Freq: Once | INTRAVENOUS | Status: AC
  Administered 2018-02-04: 1000 mL via INTRAVENOUS

## 2018-02-04 NOTE — ED Notes (Signed)
No answer to request a urine specimen

## 2018-02-04 NOTE — ED Provider Notes (Signed)
[facility] EMERGENCY DEPARTMENT Provider Note   CSN: [id number] Arrival date & time: 02/04/18  1249     History   Chief Complaint No chief complaint on file.   HPI Kristine Nunez is a 27 y.o. female with history of recent laparoscopic cholecystitis on 3/12 is here for evaluation of constipation since surgery, associated symptoms include nausea, vomiting 1, generalized abdominal pain. States she went to see general surgeon today who sent her to the emergency department. She started taking MiraLAX yesterday and actually had a large bowel movement while waiting in the emergency department lobby, reports significant improvement in her abdominal pain.She has been taking narcotic pain medications postoperatively.Her abdominal pain was sharp, all over but most significant at right upper quadrant and left upper quadrant with radiation to chest and shoulders. Also states that it hurts to burp. Abdominal pain worse with palpation, mildly improved with pain medications. Also reporting a productive cough with clear sputum for 3 days.Denies fevers, chills, exertional chest pain, shortness of breath, hematemesis, dysuria, urinary frequency. No bright red blood or melena with bowel movement today. She was seen in the ED 2 days ago for similar generalized abdominal pain.  HPI  History reviewed. No pertinent past medical history.  Patient Active Problem List   Diagnosis Date Noted  . Acute cholecystitis 01/27/2018    Past Surgical History:  Procedure Laterality Date  . CESAREAN SECTION     x 3  . CHOLECYSTECTOMY N/A 01/28/2018   Procedure: LAPAROSCOPIC CHOLECYSTECTOMY WITH INTRAOPERATIVE CHOLANGIOGRAM;  Surgeon: Manus Rudd, MD;  Location: [facility] OR;  Service: General;  Laterality: N/A;  . CHOLECYSTECTOMY  01/28/2018    OB History    No data available       Home Medications    Prior to Admission medications   Medication Sig Start Date End Date Taking? Authorizing Provider   acetaminophen (TYLENOL) 500 MG tablet Take 1 tablet (500 mg total) by mouth every 6 (six) hours as needed for mild pain or headache. 01/29/18  Yes Rayburn, Tresa Endo A, PA-C  docusate sodium (COLACE) 100 MG capsule Take 1 capsule (100 mg total) by mouth every 12 (twelve) hours. 02/02/18  Yes Caccavale, Sophia, PA-C  oxyCODONE (OXY IR/ROXICODONE) 5 MG immediate release tablet Take 1 tablet (5 mg total) by mouth every 6 (six) hours as needed for moderate pain or severe pain. 02/02/18  Yes Caccavale, Sophia, PA-C  levofloxacin (LEVAQUIN) 500 MG tablet Take 1.5 tablets (750 mg total) by mouth daily for 5 days. 02/04/18 02/09/18  Liberty Handy, PA-C    Family History No family history on file.  Social History Social History   Tobacco Use  . Smoking status: Never Smoker  . Smokeless tobacco: Never Used  Substance Use Topics  . Alcohol use: No  . Drug use: No     Allergies   Patient has no known allergies.   Review of Systems Review of Systems  Constitutional: Positive for appetite change and fever.  Respiratory: Positive for cough.   Gastrointestinal: Positive for abdominal pain, constipation (resolved today), nausea and vomiting (x1).  All other systems reviewed and are negative.    Physical Exam Updated Vital Signs BP (!) 132/101   Pulse 94   Temp 99.4 F (37.4 C) (Rectal)   Resp 18   LMP 01/27/2018 (Within Days) Comment: Neg preg test 01/27/18  SpO2 95%   Physical Exam  Constitutional: She is oriented to person, place, and time. She appears well-developed and well-nourished. No distress.  Non  toxic. Speaking in full sentences. Keeps eyes closed during exam.   HENT:  Head: Normocephalic and atraumatic.  Nose: Nose normal.  Mouth/Throat: No oropharyngeal exudate.  Moist mucous membranes   Eyes: Conjunctivae and EOM are normal. Pupils are equal, round, and reactive to light.  Neck: Normal range of motion.  Cardiovascular: Normal rate, regular rhythm and intact distal  pulses.  No murmur heard. 2+ DP and radial pulses bilaterally. No LE edema. No reproducible chest wall pain.   Pulmonary/Chest: Effort normal. No respiratory distress. She has decreased breath sounds in the right middle field, the right lower field, the left middle field and the left lower field. She has no wheezes.  Difficult exam due to body habitus and patient is not taking deep breaths, questionable decreased breath sounds to middle/lower lobes bilaterally. No obvious rales or rhonchi or wheezing. Speaking in full sentences without obvious respiratory distress. No pain with deep inspiration.  Abdominal: Soft. Bowel sounds are normal. There is tenderness in the suprapubic area. There is CVA tenderness.  Dressing dry and intact from recent laparoscopic cholecystectomy without surrounding erythema, warmth, fluctuance. TTP to suprapubic and right CVA. Active bowel sounds. No rigidity or guarding. Negative Murphys. Negative McBurney's.   Musculoskeletal: Normal range of motion. She exhibits no deformity.  Neurological: She is alert and oriented to person, place, and time.  Skin: Skin is warm and dry. Capillary refill takes less than 2 seconds.  Psychiatric: She has a normal mood and affect. Her behavior is normal. Judgment and thought content normal.  Nursing note and vitals reviewed.    ED Treatments / Results  Labs (all labs ordered are listed, but only abnormal results are displayed) Labs Reviewed  CBC - Abnormal; Notable for the following components:      Result Value   WBC 13.0 (*)    All other components within normal limits  COMPREHENSIVE METABOLIC PANEL - Abnormal; Notable for the following components:   Sodium 134 (*)    Chloride 100 (*)    Glucose, Bld 141 (*)    Albumin 3.0 (*)    ALT 55 (*)    Alkaline Phosphatase 177 (*)    Total Bilirubin 2.7 (*)    All other components within normal limits  URINALYSIS, ROUTINE W REFLEX MICROSCOPIC - Abnormal; Notable for the following  components:   Color, Urine AMBER (*)    APPearance HAZY (*)    Specific Gravity, Urine >1.046 (*)    Bilirubin Urine MODERATE (*)    Ketones, ur 20 (*)    Protein, ur 100 (*)    Nitrite POSITIVE (*)    Bacteria, UA MANY (*)    Squamous Epithelial / LPF 6-30 (*)    All other components within normal limits  URINE CULTURE  CULTURE, BLOOD (ROUTINE X 2)  CULTURE, BLOOD (ROUTINE X 2)  LIPASE, BLOOD    EKG  EKG Interpretation None       Radiology Dg Abdomen Acute W/chest  Result Date: 02/04/2018 CLINICAL DATA:  Abdominal pain.  Constipation. EXAM: DG ABDOMEN ACUTE W/ 1V CHEST COMPARISON:  CT 02/02/2018. FINDINGS: Mediastinum and hilar structures normal. Cardiomegaly with normal pulmonary vascularity. Bibasilar atelectasis/infiltrates. Small left pleural effusion. No pneumothorax. Surgical clips right upper quadrant. Air-fluid levels noted in the colon. Diarrheal illness could present in this fashion. No prominent bowel distention or free air. No acute bony abnormality. IMPRESSION: 1. Air-fluid levels noted in the colon. Diarrheal illness could present this fashion. No prominent bowel distention. No free air. 2.  Low lung volumes.  Bibasilar atelectasis/infiltrates. Electronically Signed   By: Maisie Fus  Register   On: 02/04/2018 16:11    Procedures Procedures (including critical care time)  Medications Ordered in ED Medications  sodium chloride 0.9 % bolus 1,000 mL (0 mLs Intravenous Stopped 02/04/18 2119)  cefTRIAXone (ROCEPHIN) 1 g in sodium chloride 0.9 % 100 mL IVPB (0 g Intravenous Stopped 02/04/18 2214)  acetaminophen (TYLENOL) tablet 1,000 mg (1,000 mg Oral Given 02/04/18 2124)     Initial Impression / Assessment and Plan / ED Course  I have reviewed the triage vital signs and the nursing notes.  Pertinent labs & imaging results that were available during my care of the patient were reviewed by me and considered in my medical decision making (see chart for details).  Clinical  Course as of Feb 04 2213  Tue Feb 04, 2018  1858 Ketones, ur: (!) 20 [CG]  1858 Nitrite: (!) POSITIVE [CG]  1858 WBC, UA: 6-30 [CG]  1858 WBC: (!) 13.0 [CG]  1858 Pulse Rate: (!) 115 [CG]  1858 Pulse Rate: (!) 105 [CG]  1917 FINDINGS: Mediastinum and hilar structures normal. Cardiomegaly with normal pulmonary vascularity. Bibasilar atelectasis/infiltrates. Small left pleural effusion. No pneumothorax.  Surgical clips right upper quadrant. Air-fluid levels noted in the colon. Diarrheal illness could present in this fashion. No prominent bowel distention or free air. No acute bony abnormality.  IMPRESSION: 1. Air-fluid levels noted in the colon. Diarrheal illness could present this fashion. No prominent bowel distention. No free air.  2. Low lung volumes. Bibasilar atelectasis/infiltrates. DG Abdomen Acute W/Chest [CG]    Clinical Course User Index [CG] Liberty Handy, PA-C   27 year old female here with generalized abdominal pain and constipation since laparoscopic cholecystectomy on 3/12. Was sent to ED by surgeon who saw her in clinic today however she had large bowel movement x2  in lobby and abdominal pain has resolved. On exam, she has suprapubic and left CVA. No signs of peritonitis. KUB with air fluid levels but no distention or free air. UA looks infected. Will treat for pyelonephritis with IVF and ceftriaxone in ED.  Blood and urine cultures pending.   In regards to her mildly productive cough, she has no tachypnea or hypoxia.  No pleuritic or exertional CP or SOB.  KUB with bibasilar possible atelectasis/infiltrates, however unlikely pt has coexisting pneumonia. Her mild tachycardia most likely from mild dehydration or pain. Mildly decreased lung sounds to bilateral middle/lower lobes however difficult exam as patient is not very cooperative and not taking deep inspirations even with coaching. No rectal fever.  Only mild leukocytosis WBC 13. IVF and ceftriaxone running.  Will reassess.  Final Clinical Impressions(s) / ED Diagnoses   Tachycardia improved after IVF. Repeat abdominal evaluation reassuring with improved tenderness. She is tolerating PO. She had a BM in ED. No hypoxia or tachypnea. Pt deemed appropriate for discharge at this time for pyelonephritis.  Final diagnoses:  Pyelonephritis  Postoperative abdominal pain    ED Discharge Orders        Ordered    levofloxacin (LEVAQUIN) 500 MG tablet  Daily     02/04/18 2205       Liberty Handy, PA-C 02/04/18 2214    Gerhard Munch, MD 02/06/18 1649

## 2018-02-04 NOTE — ED Notes (Signed)
Pt given water 

## 2018-02-04 NOTE — ED Provider Notes (Signed)
Patient placed in Quick Look pathway, seen and evaluated   Chief Complaint: abdominal pain  HPI:   27 year old female sent into the emergency department with diffuse abdominal pain and constipation. She had a cholecystectomy on 3/12. She was seen in the ED on 3/17 for similar symptoms and d/c to home with f/u to general surgery. She was seen this morning for a f/u appointment with general surgery this AM and states she was told to come to the ED for further evaluation. No BM in 2 days, but reports she had a large BM in the waiting room of the ED, which has improved her pain. +flatus. No fever, chills, dyspnea, chest pain, or N/V/D.   ROS: Abdominal pain, constipation   Physical Exam:   Gen: No distress  Neuro: Awake and Alert  Skin: Warm    Focused Exam: Abdomen is distended. Soft, but diffusely tender to palpation. No rebound or guarding. Lungs are CTAB. RRR, no m/r/g.   Initiation of care has begun. The patient has been counseled on the process, plan, and necessity for staying for the completion/evaluation, and the remainder of the medical screening examination    Barkley BoardsMcDonald, Galileo Colello A, PA-C 02/04/18 1526    Melene PlanFloyd, Dan, DO 02/05/18 1503

## 2018-02-04 NOTE — ED Triage Notes (Signed)
Pt presents to ED with complaints of right and left flank pain as well as lower abdominal pain and constipation. PT reports while waiting she was able to have large BM and feels a little better. PT friend reports she was told to come here by surgeon

## 2018-02-04 NOTE — Discharge Instructions (Signed)
You have a kidney infection take antibiotic as prescribed. Your labs and imaging otherwise looked ok. Your abdominal pain and recent constipation likely from narcotic pain medication, dehydration and recent surgery.   For pain 1000 mg tylenol every 6 hours. Stop taking oxycodone or other narcotic pain medications which can cause constipation. Take miralax and stool softener daily. Drink 2 L of water daily.   Return for worsening abdominal pain, vomiting, no improvement in constipation or fevers.

## 2018-02-05 ENCOUNTER — Other Ambulatory Visit (HOSPITAL_COMMUNITY): Payer: Self-pay | Admitting: Student

## 2018-02-05 DIAGNOSIS — Z9049 Acquired absence of other specified parts of digestive tract: Secondary | ICD-10-CM

## 2018-02-06 ENCOUNTER — Encounter (HOSPITAL_COMMUNITY): Admission: RE | Admit: 2018-02-06 | Payer: BLUE CROSS/BLUE SHIELD | Source: Ambulatory Visit

## 2018-02-06 LAB — URINE CULTURE

## 2018-02-09 LAB — CULTURE, BLOOD (ROUTINE X 2): CULTURE: NO GROWTH

## 2019-02-05 DIAGNOSIS — N12 Tubulo-interstitial nephritis, not specified as acute or chronic: Secondary | ICD-10-CM

## 2019-02-05 HISTORY — DX: Tubulo-interstitial nephritis, not specified as acute or chronic: N12

## 2019-06-21 IMAGING — DX DG ABDOMEN ACUTE W/ 1V CHEST
3 series · 3 of 3 positions shown · non-contrast
Comparison: CT 02/02/2018.

CLINICAL DATA: Abdominal pain.  Constipation.

EXAM:
DG ABDOMEN ACUTE W/ 1V CHEST

[chest pa]
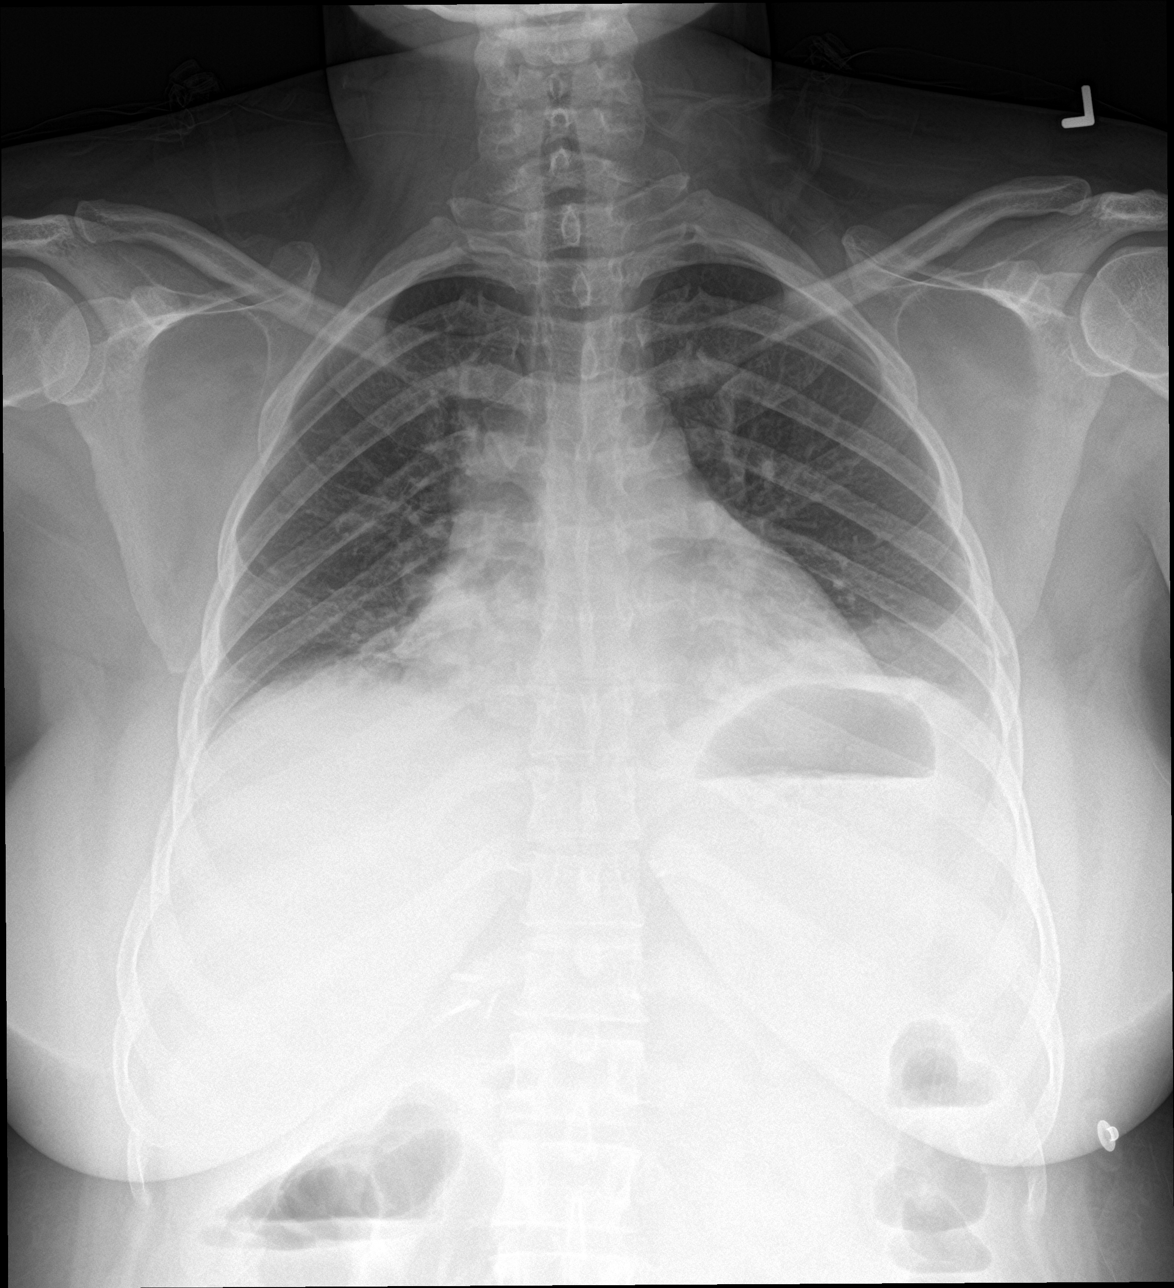

[abdomen erect]
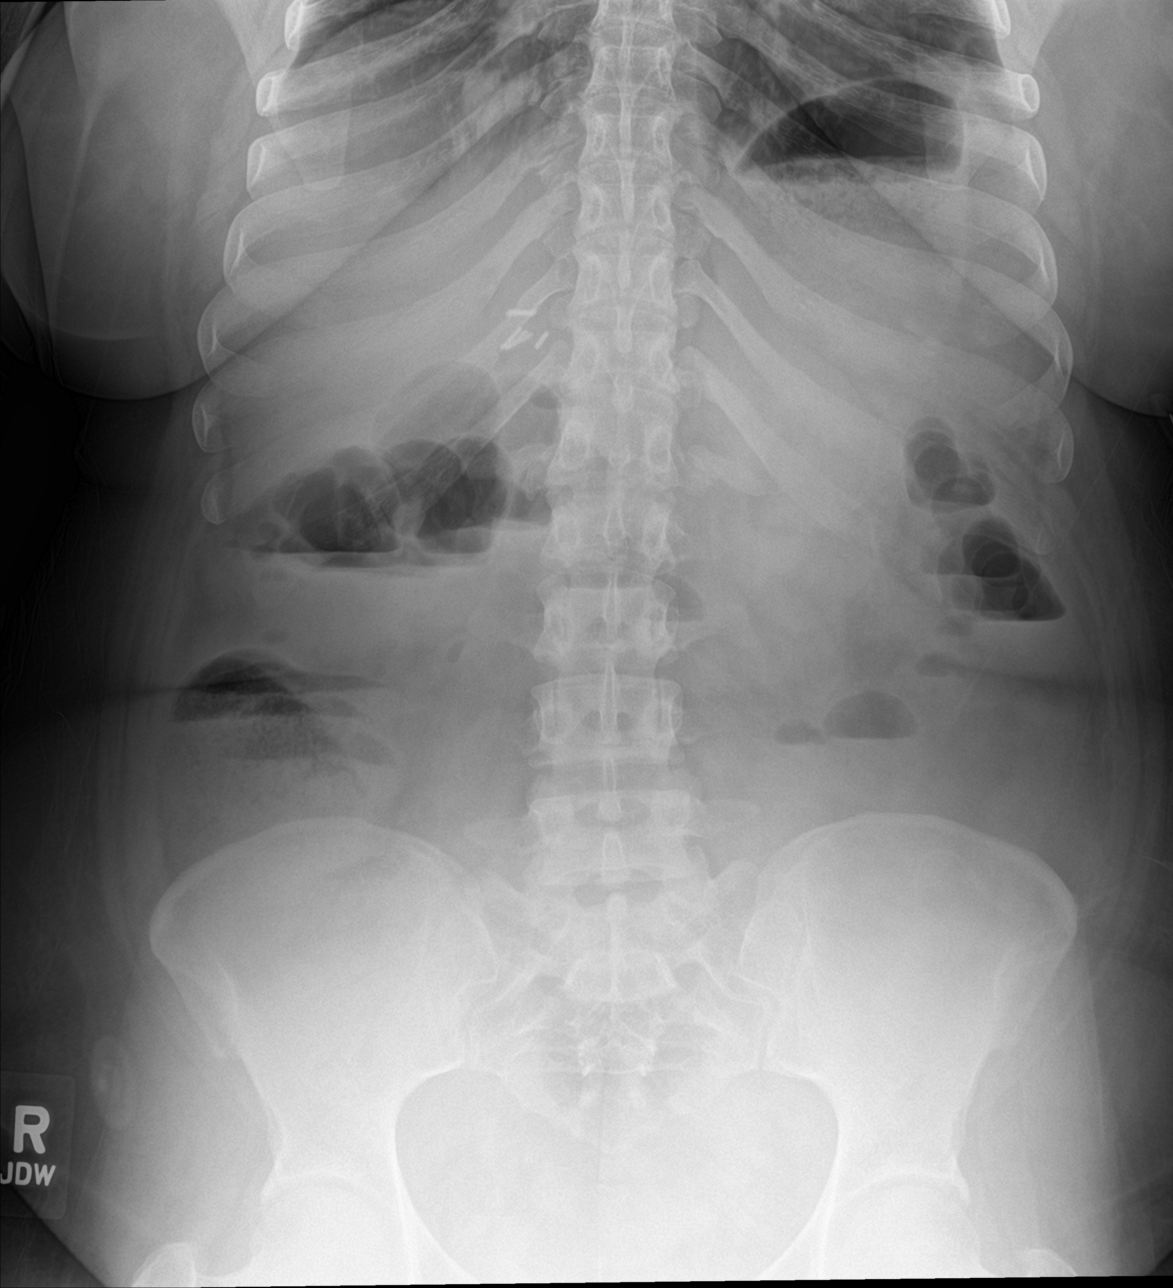

[abdomen supine]
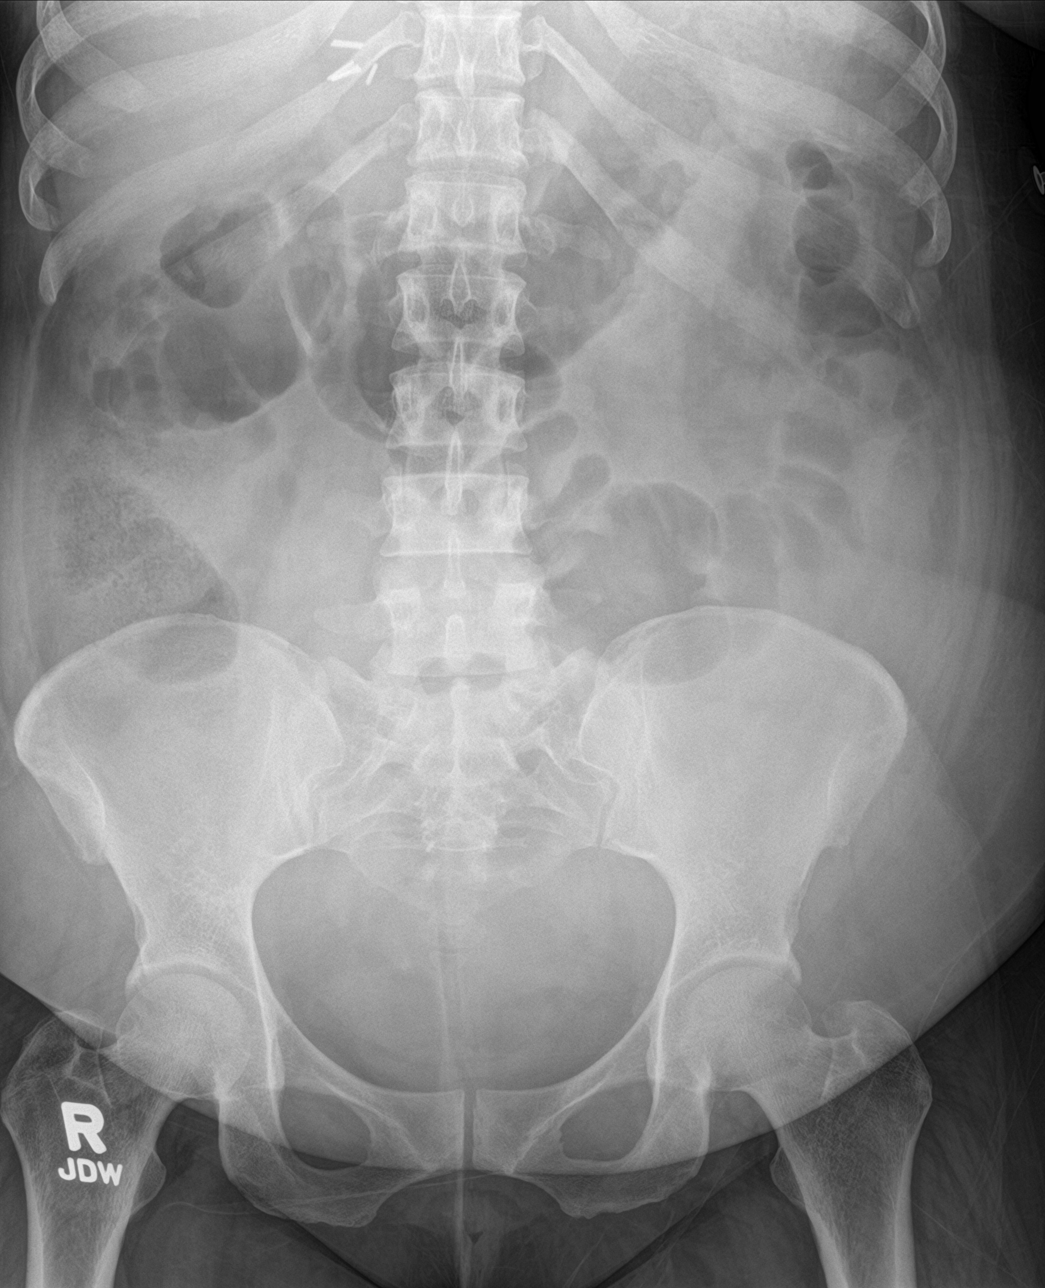

[3 of 3 positions shown; findings below may reference images not displayed]

FINDINGS: Mediastinum and hilar structures normal. Cardiomegaly with normal
pulmonary vascularity. Bibasilar atelectasis/infiltrates. Small left
pleural effusion. No pneumothorax.

Surgical clips right upper quadrant. Air-fluid levels noted in the
colon. Diarrheal illness could present in this fashion. No prominent
bowel distention or free air. No acute bony abnormality.
IMPRESSION: 1. Air-fluid levels noted in the colon. Diarrheal illness could
present this fashion. No prominent bowel distention. No free air.

2.  Low lung volumes.  Bibasilar atelectasis/infiltrates.

## 2019-07-24 ENCOUNTER — Telehealth: Payer: Self-pay

## 2019-07-24 NOTE — Telephone Encounter (Signed)
Ms Kristine Nunez me. She reported not feeling well and her employer requested covid 19 results prior to resuming work.I have directed her to go to [employer] testing site. Honor Loh RN BSN PCCN [phone]

## 2019-07-26 ENCOUNTER — Telehealth: Payer: Self-pay

## 2019-07-26 NOTE — Telephone Encounter (Signed)
Ms Luca has called me with a positive home pregnancy test. I will establish care with OBGYN office. Honor Loh RN BSN PCCN

## 2019-07-28 ENCOUNTER — Other Ambulatory Visit: Payer: Self-pay | Admitting: *Deleted

## 2019-07-28 ENCOUNTER — Telehealth: Payer: Self-pay

## 2019-07-28 DIAGNOSIS — Z20822 Contact with and (suspected) exposure to covid-19: Secondary | ICD-10-CM

## 2019-07-28 NOTE — Telephone Encounter (Signed)
I have called center for [facility] at [facility] to establish OBGYN care after a positive hone pregnancy test. The office will give Ms Oa call after the Covid test results are back. I will also be following on her behalf. I have called the patient to inform her of the above. Honor Loh RN BSn PCCN [phone]

## 2019-07-29 ENCOUNTER — Telehealth: Payer: Self-pay

## 2019-07-29 LAB — NOVEL CORONAVIRUS, NAA: SARS-CoV-2, NAA: NOT DETECTED

## 2019-07-29 NOTE — Telephone Encounter (Signed)
I have called [name] with Covid 19 test results and she verbalized understanding. Honor Loh RN BSN PCCN [phone]

## 2019-08-11 ENCOUNTER — Telehealth: Payer: Self-pay | Admitting: General Practice

## 2019-08-11 NOTE — Telephone Encounter (Signed)
Called patient with Interpreter to scheduled New OB appointment.  Message was left on VM for pt to give our office a call to schedule.

## 2019-10-19 LAB — OB RESULTS CONSOLE GC/CHLAMYDIA
Chlamydia: NEGATIVE
Gonorrhea: NEGATIVE

## 2019-10-19 LAB — GLUCOSE, 1 HOUR: Glucose 1 Hour: 127

## 2019-10-19 LAB — URINE CULTURE

## 2019-10-19 LAB — HEMOGLOBIN EVAL RFX ELECTROPHORESIS: Hemoglobin Evaluation: NORMAL

## 2019-10-19 LAB — OB RESULTS CONSOLE HIV ANTIBODY (ROUTINE TESTING): HIV: NONREACTIVE

## 2019-10-19 LAB — OB RESULTS CONSOLE RPR: RPR: NONREACTIVE

## 2019-10-19 LAB — OB RESULTS CONSOLE HEPATITIS B SURFACE ANTIGEN: Hepatitis B Surface Ag: NEGATIVE

## 2019-10-19 LAB — OB RESULTS CONSOLE PLATELET COUNT: Platelets: 337

## 2019-10-19 LAB — OB RESULTS CONSOLE HGB/HCT, BLOOD
HCT: 35 (ref 29–41)
Hemoglobin: 11.3

## 2019-10-19 LAB — OB RESULTS CONSOLE VARICELLA ZOSTER ANTIBODY, IGG: Varicella: IMMUNE

## 2019-10-19 LAB — OB RESULTS CONSOLE RUBELLA ANTIBODY, IGM: Rubella: IMMUNE

## 2019-10-19 LAB — QUANTIFERON-TB GOLD PLUS: QuantiFERON-TB Gold Plus: NEGATIVE

## 2019-11-02 ENCOUNTER — Encounter: Payer: Self-pay | Admitting: *Deleted

## 2019-11-02 ENCOUNTER — Other Ambulatory Visit: Payer: Self-pay

## 2019-11-02 ENCOUNTER — Encounter: Payer: Self-pay | Admitting: Obstetrics & Gynecology

## 2019-11-02 ENCOUNTER — Ambulatory Visit (INDEPENDENT_AMBULATORY_CARE_PROVIDER_SITE_OTHER): Payer: Medicaid Other | Admitting: Obstetrics & Gynecology

## 2019-11-02 ENCOUNTER — Encounter: Payer: Self-pay | Admitting: General Practice

## 2019-11-02 VITALS — BP 117/75 | HR 97 | Ht <= 58 in | Wt 191.3 lb

## 2019-11-02 DIAGNOSIS — O0990 Supervision of high risk pregnancy, unspecified, unspecified trimester: Secondary | ICD-10-CM | POA: Insufficient documentation

## 2019-11-02 DIAGNOSIS — O409XX0 Polyhydramnios, unspecified trimester, not applicable or unspecified: Secondary | ICD-10-CM

## 2019-11-02 DIAGNOSIS — O0992 Supervision of high risk pregnancy, unspecified, second trimester: Secondary | ICD-10-CM | POA: Diagnosis not present

## 2019-11-02 DIAGNOSIS — O99210 Obesity complicating pregnancy, unspecified trimester: Secondary | ICD-10-CM

## 2019-11-02 DIAGNOSIS — O402XX0 Polyhydramnios, second trimester, not applicable or unspecified: Secondary | ICD-10-CM

## 2019-11-02 DIAGNOSIS — Z3A24 24 weeks gestation of pregnancy: Secondary | ICD-10-CM

## 2019-11-02 DIAGNOSIS — Z98891 History of uterine scar from previous surgery: Secondary | ICD-10-CM

## 2019-11-02 DIAGNOSIS — O1210 Gestational proteinuria, unspecified trimester: Secondary | ICD-10-CM | POA: Insufficient documentation

## 2019-11-02 DIAGNOSIS — O99019 Anemia complicating pregnancy, unspecified trimester: Secondary | ICD-10-CM | POA: Insufficient documentation

## 2019-11-02 DIAGNOSIS — Z789 Other specified health status: Secondary | ICD-10-CM

## 2019-11-02 MED ORDER — BLOOD PRESSURE KIT DEVI: 1.0000 | Freq: Once | 0 refills | Status: AC

## 2019-11-02 NOTE — Patient Instructions (Signed)
Return to office for any scheduled appointments. Call the office or go to the MAU at [facility] at  if:  You begin to have strong, frequent contractions  Your water breaks.  Sometimes it is a big gush of fluid, sometimes it is just a trickle that keeps getting your panties wet or running down your legs  You have vaginal bleeding.  It is normal to have a small amount of spotting if your cervix was checked.   You do not feel your baby moving like normal.  If you do not, get something to eat and drink and lay down and focus on feeling your baby move.   If your baby is still not moving like normal, you should call the office or go to MAU.  Any other obstetric concerns.  TDaP Vaccine Pregnancy Get the Whooping Cough Vaccine While You Are Pregnant (CDC)  It is important for women to get the whooping cough vaccine in the third trimester of each pregnancy. Vaccines are the best way to prevent this disease. There are 2 different whooping cough vaccines. Both vaccines combine protection against whooping cough, tetanus and diphtheria, but they are for different age groups: Tdap: for everyone 11 years or older, including pregnant women  DTaP: for children 2 months through 6 years of age  You need the whooping cough vaccine during each of your pregnancies The recommended time to get the shot is during your 27th through 36th week of pregnancy, preferably during the earlier part of this time period. The Centers for Disease Control and Prevention (CDC) recommends that pregnant women receive the whooping cough vaccine for adolescents and adults (called Tdap vaccine) during the third trimester of each pregnancy. The recommended time to get the shot is during your 27th through 36th week of pregnancy, preferably during the earlier part of this time period. This replaces the original recommendation that pregnant women get the vaccine only if they had not previously received it. The  American College of Obstetricians and Gynecologists and the American College of Nurse-Midwives support this recommendation.  You should get the whooping cough vaccine while pregnant to pass protection to your baby frame support disabled and/or not supported in this browser  Learn why Laura decided to get the whooping cough vaccine in her 3rd trimester of pregnancy and how her baby girl was born with some protection against the disease. Also available on YouTube. After receiving the whooping cough vaccine, your body will create protective antibodies (proteins produced by the body to fight off diseases) and pass some of them to your baby before birth. These antibodies provide your baby some short-term protection against whooping cough in early life. These antibodies can also protect your baby from some of the more serious complications that come along with whooping cough. Your protective antibodies are at their highest about 2 weeks after getting the vaccine, but it takes time to pass them to your baby. So the preferred time to get the whooping cough vaccine is early in your third trimester. The amount of whooping cough antibodies in your body decreases over time. That is why CDC recommends you get a whooping cough vaccine during each pregnancy. Doing so allows each of your babies to get the greatest number of protective antibodies from you. This means each of your babies will get the best protection possible against this disease.  Getting the whooping cough vaccine while pregnant is better than getting the vaccine after you give birth Whooping cough vaccination during   pregnancy is ideal so your baby will have short-term protection as soon as he is born. This early protection is important because your baby will not start getting his whooping cough vaccines until he is 2 months old. These first few months of life are when your baby is at greatest risk for catching whooping cough. This is also when he's at  greatest risk for having severe, potentially life-threating complications from the infection. To avoid that gap in protection, it is best to get a whooping cough vaccine during pregnancy. You will then pass protection to your baby before he is born. To continue protecting your baby, he should get whooping cough vaccines starting at 2 months old. You may never have gotten the Tdap vaccine before and did not get it during this pregnancy. If so, you should make sure to get the vaccine immediately after you give birth, before leaving the hospital or birthing center. It will take about 2 weeks before your body develops protection (antibodies) in response to the vaccine. Once you have protection from the vaccine, you are less likely to give whooping cough to your newborn while caring for him. But remember, your baby will still be at risk for catching whooping cough from others. A recent study looked to see how effective Tdap was at preventing whooping cough in babies whose mothers got the vaccine while pregnant or in the hospital after giving birth. The study found that getting Tdap between 27 through 36 weeks of pregnancy is 85% more effective at preventing whooping cough in babies younger than 2 months old. Blood tests cannot tell if you need a whooping cough vaccine There are no blood tests that can tell you if you have enough antibodies in your body to protect yourself or your baby against whooping cough. Even if you have been sick with whooping cough in the past or previously received the vaccine, you still should get the vaccine during each pregnancy. Breastfeeding may pass some protective antibodies onto your baby By breastfeeding, you may pass some antibodies you have made in response to the vaccine to your baby. When you get a whooping cough vaccine during your pregnancy, you will have antibodies in your breast milk that you can share with your baby as soon as your milk comes in. However, your baby will not  get protective antibodies immediately if you wait to get the whooping cough vaccine until after delivering your baby. This is because it takes about 2 weeks for your body to create antibodies. Learn more about the health benefits of breastfeeding.  

## 2019-11-02 NOTE — Progress Notes (Signed)
Medicaid home form completed at today's visit.   Apolonio Schneiders RN 11/02/19

## 2019-11-02 NOTE — Progress Notes (Signed)
   PRENATAL VISIT NOTE  Subjective:  Kristine Nunez is a 28 y.o. G4P3000 at 60w5dbeing [facility] at [redacted] weeks gestation.  Due to language barrier, a Swahili interpreter was present during the history-taking and subsequent discussion (and for part of the physical exam) with this patient. Anatomy scan showed AFI of 28 cm, no anatomic anomalies.  Early 1 hr GTT was 127.   She is currently monitored for the following issues for this high-risk pregnancy and has Supervision of high risk pregnancy, antepartum; Polyhydramnios affecting pregnancy; Language barrier; History of C-section; Obesity in pregnancy; Anemia affecting pregnancy; and Gestational proteinuria, antepartum on their problem list.  Patient reports no complaints.  Contractions: Not present. Vag. Bleeding: None.  Movement: Present. Denies leaking of fluid.   The following portions of the patient's history were reviewed and updated as appropriate: allergies, current medications, past family history, past medical history, past social history, past surgical history and problem list.   Objective:   Vitals:   11/02/19 1520 11/02/19 1532  BP: 117/75   Pulse: 97   Weight: 191 lb 4.8 oz (86.8 kg)   Height:  4' 9.48" (1.46 m)    Fetal Status: Fetal Heart Rate (bpm): 138   Movement: Present     General:  Alert, oriented and cooperative. Patient is in no acute distress.  Skin: Skin is warm and dry. No rash noted.   Cardiovascular: Normal heart rate noted  Respiratory: Normal respiratory effort, no problems with respiration noted  Abdomen: Soft, gravid, appropriate for gestational age.  Pain/Pressure: Absent     Pelvic: Cervical exam deferred        Extremities: Normal range of motion.  Edema: None  Mental Status: Normal mood and affect. Normal behavior. Normal judgment and thought content.   Assessment and Plan:  Pregnancy: G4P3000 at  261w5d. Polyhydramnios in second trimester, single fetus Will need follow up scan, this was ordered.  Repeat GTT to be done at next visit. - [place] OB [place] +14 WK; Future  2. History of C-section x 1 Counseled regarding TOLAC vs RCS; risks/benefits discussed in detail. All questions answered.  Patient elects for TOLAC, consent signed 11/02/2019.  3. Supervision of high risk pregnancy, antepartum [facility]. The nature of [facility] multiple MDs and other Advanced Practice Providers was explained to patient; also emphasized that residents, students are part of our team. - Blood Pressure Monitoring (BLOOD PRESSURE KIT) DEVI; 1 Device by Does not apply route once for 1 dose.  Dispense: 1 each; Refill: 0 Preterm labor symptoms and general obstetric precautions including but not limited to vaginal bleeding, contractions, leaking of fluid and fetal movement were reviewed in detail with the patient. Please refer to After Visit Summary for other counseling recommendations.   Return in about 3 weeks (around 11/23/2019) for 2 hr GTT, 3rd trimester labs, TDap, [facility] ([place]ili interpreter) - also needs MFM scan.  Future Appointments  Date Time Provider [place]  8:35 AM DoEmily FilbertMD [place]1/06/2020  9:30 AM WOC-WOCA LAB WOC-WOCA WOC  11/27/2019 10:30 AM WH-MFC NURSE WH-MFC MFC-US  11/27/2019 10:30 AM [place] WH-MFCUS MFC-US    UgVerita SchneidersMD

## 2019-11-03 ENCOUNTER — Encounter: Payer: Self-pay | Admitting: *Deleted

## 2019-11-03 DIAGNOSIS — O1210 Gestational proteinuria, unspecified trimester: Secondary | ICD-10-CM

## 2019-11-03 DIAGNOSIS — O409XX0 Polyhydramnios, unspecified trimester, not applicable or unspecified: Secondary | ICD-10-CM

## 2019-11-03 DIAGNOSIS — Z98891 History of uterine scar from previous surgery: Secondary | ICD-10-CM

## 2019-11-03 DIAGNOSIS — O0990 Supervision of high risk pregnancy, unspecified, unspecified trimester: Secondary | ICD-10-CM

## 2019-11-03 DIAGNOSIS — Z789 Other specified health status: Secondary | ICD-10-CM

## 2019-11-03 DIAGNOSIS — O99019 Anemia complicating pregnancy, unspecified trimester: Secondary | ICD-10-CM

## 2019-11-03 DIAGNOSIS — O99210 Obesity complicating pregnancy, unspecified trimester: Secondary | ICD-10-CM

## 2019-11-04 ENCOUNTER — Encounter: Payer: Self-pay | Admitting: *Deleted

## 2019-11-20 NOTE — L&D Delivery Note (Signed)
Operative Delivery Note At 6:40 AM a viable female was delivered via VBAC, Vacuum Assisted.  Presentation: vertex; Position: Occiput,, Anterior; Station: +1.  Verbal consent: obtained from patient.  Risks and benefits discussed in detail.  Risks include, but are not limited to the risks of anesthesia, bleeding, infection, damage to maternal tissues, fetal cephalhematoma.  There is also the risk of inability to effect vaginal delivery of the head, or shoulder dystocia that cannot be resolved by established maneuvers, leading to the need for emergency cesarean section.  APGAR: 6, 8; weight 6 lb 15.3 oz (3155 g).   Placenta status:retained bilobed at fundus.   Cord: 3 vessel with the following complications: cord avulsion, retained placenta banjo curette used to free the placenta from the uterine cavity.  Long ringed forceps were used to remove the placenta initially in segment then mostly intact remaining placenta was removed. Pt subsequently had PPH, improved with curettage, massage, pitocin, TXA and misoprostol.   Anesthesia:  Epidural  Instruments: Vacuum, max pressure, 3 pulls, 3 pushes, 0 pop offs. Foley removed prior to procedure. Placed at +1, LOA Episiotomy: None Lacerations: 1st degree Suture Repair: 3.0 vicryl Est. Blood Loss (mL): 2000  Mom to postpartum.  Baby to Couplet care / Skin to Skin.  Malachy Chamber 02/19/2020, 8:46 AM

## 2019-11-24 ENCOUNTER — Other Ambulatory Visit: Payer: Self-pay | Admitting: *Deleted

## 2019-11-24 DIAGNOSIS — O0990 Supervision of high risk pregnancy, unspecified, unspecified trimester: Secondary | ICD-10-CM

## 2019-11-26 ENCOUNTER — Encounter: Payer: Medicaid Other | Admitting: Medical

## 2019-11-27 ENCOUNTER — Ambulatory Visit (HOSPITAL_COMMUNITY): Payer: Medicaid Other | Admitting: *Deleted

## 2019-11-27 ENCOUNTER — Ambulatory Visit (HOSPITAL_COMMUNITY)
Admission: RE | Admit: 2019-11-27 | Discharge: 2019-11-27 | Disposition: A | Discharge status: Home / Self Care | Payer: Medicaid Other | Source: Ambulatory Visit | Attending: Obstetrics and Gynecology | Admitting: Obstetrics and Gynecology

## 2019-11-27 ENCOUNTER — Ambulatory Visit (INDEPENDENT_AMBULATORY_CARE_PROVIDER_SITE_OTHER): Payer: Medicaid Other | Admitting: Obstetrics & Gynecology

## 2019-11-27 ENCOUNTER — Other Ambulatory Visit: Payer: Self-pay

## 2019-11-27 ENCOUNTER — Other Ambulatory Visit (HOSPITAL_COMMUNITY): Payer: Self-pay | Admitting: *Deleted

## 2019-11-27 ENCOUNTER — Other Ambulatory Visit: Payer: Medicaid Other

## 2019-11-27 ENCOUNTER — Encounter (HOSPITAL_COMMUNITY): Payer: Self-pay

## 2019-11-27 VITALS — BP 123/83 | HR 94 | Wt 192.7 lb

## 2019-11-27 DIAGNOSIS — O1210 Gestational proteinuria, unspecified trimester: Secondary | ICD-10-CM | POA: Diagnosis present

## 2019-11-27 DIAGNOSIS — O409XX0 Polyhydramnios, unspecified trimester, not applicable or unspecified: Secondary | ICD-10-CM

## 2019-11-27 DIAGNOSIS — O0990 Supervision of high risk pregnancy, unspecified, unspecified trimester: Secondary | ICD-10-CM

## 2019-11-27 DIAGNOSIS — O99210 Obesity complicating pregnancy, unspecified trimester: Secondary | ICD-10-CM

## 2019-11-27 DIAGNOSIS — Z3A28 28 weeks gestation of pregnancy: Secondary | ICD-10-CM | POA: Diagnosis not present

## 2019-11-27 DIAGNOSIS — O402XX0 Polyhydramnios, second trimester, not applicable or unspecified: Secondary | ICD-10-CM | POA: Diagnosis present

## 2019-11-27 DIAGNOSIS — O99213 Obesity complicating pregnancy, third trimester: Secondary | ICD-10-CM | POA: Diagnosis not present

## 2019-11-27 DIAGNOSIS — Z789 Other specified health status: Secondary | ICD-10-CM | POA: Insufficient documentation

## 2019-11-27 DIAGNOSIS — Z98891 History of uterine scar from previous surgery: Secondary | ICD-10-CM | POA: Diagnosis present

## 2019-11-27 DIAGNOSIS — Z23 Encounter for immunization: Secondary | ICD-10-CM

## 2019-11-27 DIAGNOSIS — O99019 Anemia complicating pregnancy, unspecified trimester: Secondary | ICD-10-CM

## 2019-11-27 NOTE — Addendum Note (Signed)
Addended by: Maxwell Marion E on: 11/27/2019 12:12 PM   Modules accepted: Orders

## 2019-11-28 LAB — CBC
Hematocrit: 34.1 % (ref 34.0–46.6)
Hemoglobin: 11.9 g/dL (ref 11.1–15.9)
MCH: 31.3 pg (ref 26.6–33.0)
MCHC: 34.9 g/dL (ref 31.5–35.7)
MCV: 90 fL (ref 79–97)
Platelets: 319 10*3/uL (ref 150–450)
RBC: 3.8 x10E6/uL (ref 3.77–5.28)
RDW: 13.4 % (ref 11.7–15.4)
WBC: 5.7 10*3/uL (ref 3.4–10.8)

## 2019-11-28 LAB — ABO/RH: Rh Factor: POSITIVE

## 2019-11-28 LAB — GLUCOSE TOLERANCE, 2 HOURS W/ 1HR
Glucose, 1 hour: 150 mg/dL (ref 65–179)
Glucose, 2 hour: 150 mg/dL (ref 65–152)
Glucose, Fasting: 74 mg/dL (ref 65–91)

## 2019-11-28 LAB — HIV ANTIBODY (ROUTINE TESTING W REFLEX): HIV Screen 4th Generation wRfx: NONREACTIVE

## 2019-11-28 LAB — RPR: RPR Ser Ql: NONREACTIVE

## 2019-12-11 ENCOUNTER — Encounter (HOSPITAL_COMMUNITY): Payer: Self-pay | Admitting: Emergency Medicine

## 2019-12-11 ENCOUNTER — Other Ambulatory Visit: Payer: Self-pay

## 2019-12-11 ENCOUNTER — Ambulatory Visit (HOSPITAL_COMMUNITY)
Admission: EM | Admit: 2019-12-11 | Discharge: 2019-12-11 | Disposition: A | Discharge status: Home / Self Care | Payer: Medicaid Other | Attending: Family Medicine | Admitting: Family Medicine

## 2019-12-11 DIAGNOSIS — R1032 Left lower quadrant pain: Secondary | ICD-10-CM

## 2019-12-11 MED ORDER — ACETAMINOPHEN 500 MG PO TABS: 1000.0000 mg | ORAL_TABLET | Freq: Three times a day (TID) | ORAL | 0 refills | Status: AC | PRN

## 2019-12-11 NOTE — Discharge Instructions (Addendum)
Recommend start Tylenol 1000mg  every 8 hours as needed for pain. Recommend contact your OB on Monday to schedule appointment for further evaluation of pain and need for work note and leave. If pain gets more severe, go to the ER.

## 2019-12-11 NOTE — ED Triage Notes (Signed)
Pt here for back and leg pain; pt is 7 months pregnant and requesting a work note; pt denies issues with pregnancy and is  Getting prenatal care

## 2019-12-12 NOTE — ED Provider Notes (Signed)
[facility]    CSN: [id number] Arrival date & time: 12/11/19  1719      History   Chief Complaint Chief Complaint  Patient presents with  . Back Pain  . Leg Pain    HPI Kristine Nunez is a 29 y.o. female.   29 year old female presents with left upper leg/groin pain that started about 3 days ago. She is currently 7 months pregnant (32 weeks- due date 02/17/2020) and has been receiving routine prenatal Care through [facility]. She is currently a high risk pregnancy due to polyhydramnios on U/S at 22 weeks. She has contacted her OB and they are unable to see her until Feb 1st. She indicates the pain is constant but worse when standing. This is her 4th pregnancy and she denies any contractions or abdominal pain. She also denies any fever, lower leg pain, swelling, redness or numbness. She stands for her job and is requesting a note to be out of work until the pain resolves or she delivers her baby. No previous history of similar pain. No other chronic health issues. Only takes Prenatal vitamins. Has not taken any OTC medication for pain.   The history is provided by the patient. The history is limited by a language barrier. A language interpreter was used (Swahili interpreter used but patient still seemed to have difficulty comprehending our conversation. ).    Past Medical History:  Diagnosis Date  . Cholecystitis 2019  . Pyelonephritis 02/05/2019    Patient Active Problem List   Diagnosis Date Noted  . Supervision of high risk pregnancy, antepartum 11/02/2019  . Polyhydramnios affecting pregnancy 11/02/2019  . Language barrier 11/02/2019  . History of C-section 11/02/2019  . Obesity in pregnancy 11/02/2019  . Anemia affecting pregnancy 11/02/2019  . Gestational proteinuria, antepartum 11/02/2019    Past Surgical History:  Procedure Laterality Date  . CESAREAN SECTION     x1  . CHOLECYSTECTOMY N/A 01/28/2018   Procedure: LAPAROSCOPIC  CHOLECYSTECTOMY WITH INTRAOPERATIVE CHOLANGIOGRAM;  Surgeon: Donnie Mesa, MD;  Location: [facility];  Service: General;  Laterality: N/A;  . CHOLECYSTECTOMY  01/28/2018    OB History    Gravida  4   Para  3   Term  3   Preterm      AB      Living  3     SAB      TAB      Ectopic      Multiple      Live Births  3            Home Medications    Prior to Admission medications   Medication Sig Start Date End Date Taking? Authorizing Provider  acetaminophen (TYLENOL) 500 MG tablet Take 2 tablets (1,000 mg total) by mouth every 8 (eight) hours as needed for moderate pain. 12/11/19   Katy Apo, NP  Prenatal Vit-Fe Fumarate-FA (PRENATAL PO) Take by mouth.    [provider]    Family History History reviewed. No pertinent family history.  Social History Social History   Tobacco Use  . Smoking status: Never Smoker  . Smokeless tobacco: Never Used  Substance Use Topics  . Alcohol use: No  . Drug use: No     Allergies   Patient has no known allergies.   Review of Systems Review of Systems  Constitutional: Positive for fatigue. Negative for activity change, appetite change, chills and fever.  Respiratory: Negative for chest tightness and  shortness of breath.   Gastrointestinal: Negative for abdominal pain, nausea and vomiting.  Genitourinary: Negative for flank pain, pelvic pain, vaginal bleeding and vaginal pain.  Musculoskeletal: Positive for myalgias. Negative for back pain.  Skin: Negative for color change and rash.  Neurological: Negative for tremors, seizures, syncope, weakness and numbness.     Physical Exam Triage Vital Signs ED Triage Vitals  Enc Vitals Group     BP 12/11/19 1802 116/82     Pulse Rate 12/11/19 1802 72     Resp 12/11/19 1802 18     Temp 12/11/19 1802 98.5 F (36.9 C)     Temp Source 12/11/19 1802 Oral     SpO2 12/11/19 1802 100 %     Weight --      Height --      Head Circumference --      [place] Flow --        Pain Score 12/11/19 1803 8     Pain Loc --      Pain Edu? --      Excl. in GC? --    No data found.  Updated Vital Signs BP 116/82 (BP Location: Right Arm)   Pulse 72   Temp 98.5 F (36.9 C) (Oral)   Resp 18   LMP 05/13/2019 (Approximate)   SpO2 100%   Visual Acuity Right Eye Distance:   Left Eye Distance:   Bilateral Distance:    Right Eye Near:   Left Eye Near:    Bilateral Near:     Physical Exam Vitals and nursing note reviewed.  Constitutional:      General: She is awake. She is not in acute distress.    Appearance: She is not ill-appearing.     Comments: Patient is sitting comfortably in exam chair in no acute distress but appears uncomfortable due to uterine size and upper leg/groin pain.   HENT:     Head: Normocephalic and atraumatic.  Eyes:     Extraocular Movements: Extraocular movements intact.     Conjunctiva/sclera: Conjunctivae normal.  Cardiovascular:     Rate and Rhythm: Normal rate.  Pulmonary:     Effort: Pulmonary effort is normal.  Abdominal:     Tenderness: There is no abdominal tenderness. There is no right CVA tenderness or left CVA tenderness.  Musculoskeletal:        General: Tenderness present. No swelling. Normal range of motion.     Right upper leg: Normal.     Left upper leg: Tenderness present. No swelling or edema.     Right lower leg: No edema.     Left lower leg: No edema.       Legs:     Comments: Has full range of motion of both legs. No increase in pain with movement. No swelling, redness, warmth present. No visual varicose veins present. Tender along left upper groin area. No muscle spasms or contractions felt. No neuro deficits noted. Good distal pulses. No pedal edema.   Skin:    General: Skin is warm and dry.     Capillary Refill: Capillary refill takes less than 2 seconds.     Findings: No ecchymosis, erythema, petechiae or rash.  Neurological:     General: No focal deficit present.     Mental Status: She is alert  and oriented to person, place, and time.  Psychiatric:        Attention and Perception: Attention normal.        Mood and Affect:  Mood normal.        Behavior: Behavior normal. Behavior is cooperative.      UC Treatments / Results  Labs (all labs ordered are listed, but only abnormal results are displayed) Labs Reviewed - No data to display  EKG   Radiology No results found.  Procedures Procedures (including critical care time)  Medications Ordered in UC Medications - No data to display  Initial Impression / Assessment and Plan / UC Course  I have reviewed the triage vital signs and the nursing notes.  Pertinent labs & imaging results that were available during my care of the patient were reviewed by me and considered in my medical decision making (see chart for details).    Discussed with patient low probability of blood clot in leg/groin area. Discussed that pain may be due to ligaments in the pelvic area that can become stretched due to pregnancy. No distinct uterine contractions. Patient is stable with no emergent symptoms and clinical findings at this time. Patient is not having back pain. May try Tylenol 1000mg  every 8 hours as needed for pain. Discussed multiple times that a note to be out of work would need to come from her OB. We would not be able to take her out of work for more than 2 days. She continued to ask to be put out of work and could not understand why would could not write a note to be out of work for a week until her next OB visit. Again, discussed that a prolonged leave from work (greater than 3 days) needs to come from a PCP or her OB. Discussed that I will put a referral in her chart for her OB to contact her on Monday (1/25) to try to see her sooner than Feb 1st. Also discussed that if pain gets more severe, she needs to go to the ER ASAP. Patient understands and will contact her OB on Monday 1/25 (if her OB does not contact her first) for further evaluation.    Final Clinical Impressions(s) / UC Diagnoses   Final diagnoses:  Left groin pain     Discharge Instructions     Recommend start Tylenol 1000mg  every 8 hours as needed for pain. Recommend contact your OB on Monday to schedule appointment for further evaluation of pain and need for work note and leave. If pain gets more severe, go to the ER.     ED Prescriptions    Medication Sig Dispense Auth. Provider   acetaminophen (TYLENOL) 500 MG tablet Take 2 tablets (1,000 mg total) by mouth every 8 (eight) hours as needed for moderate pain. 30 tablet Delawrence Fridman, , NP     PDMP not reviewed this encounter.   Sunday, NP 12/12/19 1152

## 2019-12-14 ENCOUNTER — Telehealth: Payer: Self-pay | Admitting: Family Medicine

## 2019-12-14 NOTE — Telephone Encounter (Signed)
Patient is requesting a call back to get a letter to be out of work.

## 2019-12-21 ENCOUNTER — Ambulatory Visit (HOSPITAL_COMMUNITY)
Admission: RE | Admit: 2019-12-21 | Discharge: 2019-12-21 | Disposition: A | Discharge status: Home / Self Care | Payer: Medicaid Other | Source: Ambulatory Visit | Attending: Obstetrics | Admitting: Obstetrics

## 2019-12-21 ENCOUNTER — Ambulatory Visit (INDEPENDENT_AMBULATORY_CARE_PROVIDER_SITE_OTHER): Payer: Medicaid Other | Admitting: Nurse Practitioner

## 2019-12-21 ENCOUNTER — Other Ambulatory Visit: Payer: Self-pay

## 2019-12-21 ENCOUNTER — Telehealth: Payer: Self-pay

## 2019-12-21 ENCOUNTER — Ambulatory Visit (HOSPITAL_COMMUNITY): Payer: Medicaid Other | Admitting: *Deleted

## 2019-12-21 ENCOUNTER — Encounter (HOSPITAL_COMMUNITY): Payer: Self-pay | Admitting: *Deleted

## 2019-12-21 VITALS — BP 120/81 | HR 79 | Wt 191.2 lb

## 2019-12-21 DIAGNOSIS — Z789 Other specified health status: Secondary | ICD-10-CM | POA: Insufficient documentation

## 2019-12-21 DIAGNOSIS — Z98891 History of uterine scar from previous surgery: Secondary | ICD-10-CM | POA: Insufficient documentation

## 2019-12-21 DIAGNOSIS — O409XX0 Polyhydramnios, unspecified trimester, not applicable or unspecified: Secondary | ICD-10-CM

## 2019-12-21 DIAGNOSIS — O99013 Anemia complicating pregnancy, third trimester: Secondary | ICD-10-CM | POA: Insufficient documentation

## 2019-12-21 DIAGNOSIS — Z3A31 31 weeks gestation of pregnancy: Secondary | ICD-10-CM

## 2019-12-21 DIAGNOSIS — O0990 Supervision of high risk pregnancy, unspecified, unspecified trimester: Secondary | ICD-10-CM | POA: Insufficient documentation

## 2019-12-21 DIAGNOSIS — Z362 Encounter for other antenatal screening follow-up: Secondary | ICD-10-CM | POA: Diagnosis not present

## 2019-12-21 DIAGNOSIS — O1210 Gestational proteinuria, unspecified trimester: Secondary | ICD-10-CM | POA: Diagnosis present

## 2019-12-21 DIAGNOSIS — Z603 Acculturation difficulty: Secondary | ICD-10-CM

## 2019-12-21 DIAGNOSIS — O99213 Obesity complicating pregnancy, third trimester: Secondary | ICD-10-CM | POA: Insufficient documentation

## 2019-12-21 DIAGNOSIS — O99210 Obesity complicating pregnancy, unspecified trimester: Secondary | ICD-10-CM | POA: Diagnosis present

## 2019-12-21 DIAGNOSIS — O26893 Other specified pregnancy related conditions, third trimester: Secondary | ICD-10-CM

## 2019-12-21 DIAGNOSIS — O0993 Supervision of high risk pregnancy, unspecified, third trimester: Secondary | ICD-10-CM

## 2019-12-21 DIAGNOSIS — R102 Pelvic and perineal pain: Secondary | ICD-10-CM

## 2019-12-21 NOTE — Progress Notes (Signed)
Pt has live [employer]

## 2019-12-21 NOTE — Progress Notes (Signed)
Subjective:  Kristine Nunez is a 29 y.o. K2H0623 at [redacted]w[redacted]d being seen today for ongoing prenatal care.  She is currently monitored for the following issues for this high-risk pregnancy and has Supervision of high risk pregnancy, antepartum; Polyhydramnios affecting pregnancy; Language barrier; History of C-section; Obesity in pregnancy; Anemia affecting pregnancy; and Gestational proteinuria, antepartum on their problem list. In person interpreter is present in the room for the entire visit today.  Patient reports pain in left groin that keeps her from being able to stand for 8 hours at work.  Has been to the ER and was told to follow up with OB MD.  Asking for a note to be out of work for the rest of this pregnancy.  Works 8 hours a day for 5 days a week at the [employer].  Has been out of work for 2 weeks.  Has tried Tylenol but is still not able to stand and walk for long periods.  Contractions: Irritability.  .  Movement: Present. Denies leaking of fluid.   The following portions of the patient's history were reviewed and updated as appropriate: allergies, current medications, past family history, past medical history, past social history, past surgical history and problem list. Problem list updated.  Objective:   Vitals:   12/21/19 1419  BP: 120/81  Pulse: 79  Weight: 191 lb 3.2 oz (86.7 kg)    Fetal Status: Fetal Heart Rate (bpm): 138   Movement: Present     General:  Alert, oriented and cooperative. Patient is in no acute distress.  Skin: Skin is warm and dry. No rash noted.   Cardiovascular: Normal heart rate noted  Respiratory: Normal respiratory effort, no problems with respiration noted  Abdomen: Soft, gravid, appropriate for gestational age. Pain/Pressure: Absent     Pelvic:  Cervical exam deferred        Extremities: Normal range of motion.  Edema: Trace  Mental Status: Normal mood and affect. Normal behavior. Normal judgment and thought content.  Able  to stand and walk without assistance but pregnant abdomen is very prominent and likely consistent with polyhydramnious. Additionally has pubic symphysis pain with palpation of low midline pubic symphysis.  Urinalysis:      Assessment and Plan:  Pregnancy: [name] at [redacted]w[redacted]d  1. Supervision of high risk pregnancy, antepartum Requesting note to be out of work.  Consultation with Dr. Nehemiah Settle and note given to be out of work including the 2 weeks she has already been out of work. Will arrange for PT to show client exercises to assist in keeping muscles stretched and toned.  Explained that this is common pain in pregnancy and tends to be worse with additional pregnancies.  Client desires to have another child after this pregnancy so this could be valuable information for her even beyond the current pregnancy.  - Ambulatory referral to Physical Therapy  2. Pelvic pain affecting pregnancy in third trimester, antepartum See above  - Ambulatory referral to Physical Therapy  3. Polyhydramnios affecting pregnancy Ultrasound today for further evaluation.  On review of results - polyhydramnios has resolved.  Problem list updated.  Preterm labor symptoms and general obstetric precautions including but not limited to vaginal bleeding, contractions, leaking of fluid and fetal movement were reviewed in detail with the patient. Please refer to After Visit Summary for other counseling recommendations.  Return in about 2 weeks (around 01/04/2020) for in person ROB.  Earlie Server, RN, MSN, NP-BC Nurse Practitioner, [facility] for [employer]  [facility], [facility] 12/21/2019 3:00 PM

## 2019-12-21 NOTE — Telephone Encounter (Signed)
Called pt to advise of Physical Therapy appt w/ [facility] on 12/23/19 @ 4:15p, no answer, [facility] pre[facility] left Pt VM.

## 2019-12-23 ENCOUNTER — Ambulatory Visit: Payer: Medicaid Other | Attending: Nurse Practitioner | Admitting: Physical Therapy

## 2019-12-23 DIAGNOSIS — R252 Cramp and spasm: Secondary | ICD-10-CM | POA: Insufficient documentation

## 2019-12-23 DIAGNOSIS — M6281 Muscle weakness (generalized): Secondary | ICD-10-CM | POA: Insufficient documentation

## 2019-12-23 DIAGNOSIS — R1032 Left lower quadrant pain: Secondary | ICD-10-CM | POA: Insufficient documentation

## 2020-01-04 ENCOUNTER — Other Ambulatory Visit: Payer: Self-pay

## 2020-01-04 ENCOUNTER — Ambulatory Visit (INDEPENDENT_AMBULATORY_CARE_PROVIDER_SITE_OTHER): Payer: Medicaid Other | Admitting: Nurse Practitioner

## 2020-01-04 VITALS — BP 117/71 | HR 84 | Temp 97.8°F | Wt 193.0 lb

## 2020-01-04 DIAGNOSIS — O0990 Supervision of high risk pregnancy, unspecified, unspecified trimester: Secondary | ICD-10-CM

## 2020-01-04 DIAGNOSIS — O26893 Other specified pregnancy related conditions, third trimester: Secondary | ICD-10-CM

## 2020-01-04 DIAGNOSIS — Z3A33 33 weeks gestation of pregnancy: Secondary | ICD-10-CM

## 2020-01-04 DIAGNOSIS — O0993 Supervision of high risk pregnancy, unspecified, third trimester: Secondary | ICD-10-CM

## 2020-01-04 DIAGNOSIS — Z789 Other specified health status: Secondary | ICD-10-CM

## 2020-01-04 DIAGNOSIS — R102 Pelvic and perineal pain: Secondary | ICD-10-CM

## 2020-01-04 NOTE — Progress Notes (Signed)
    Subjective:  Kristine Nunez is a 29 y.o. [id number] at [redacted]w[redacted]d being seen today for ongoing prenatal care.  She is currently monitored for the following issues for this high-risk pregnancy and has Supervision of high risk pregnancy, antepartum; Language barrier; History of C-section; Obesity in pregnancy; Anemia affecting pregnancy; and Gestational proteinuria, antepartum on their problem list. In person interpreter present for the entire visit.  Patient reports continued left groin pain. Also needs to pick up her work papers today.  Dropped off earlier this month.  Will check with registration at check out.  Contractions: Not present. Vag. Bleeding: None.  Movement: Present. Denies leaking of fluid.   The following portions of the patient's history were reviewed and updated as appropriate: allergies, current medications, past family history, past medical history, past social history, past surgical history and problem list. Problem list updated.  Objective:   Vitals:   01/04/20 1441  BP: 117/71  Pulse: 84  Temp: 97.8 F (36.6 C)  Weight: 193 lb (87.5 kg)    Fetal Status: Fetal Heart Rate (bpm): 135 Fundal Height: 38 cm Movement: Present     General:  Alert, oriented and cooperative. Patient is in no acute distress.  Skin: Skin is warm and dry. No rash noted.   Cardiovascular: Normal heart rate noted  Respiratory: Normal respiratory effort, no problems with respiration noted  Abdomen: Soft, gravid, appropriate for gestational age. Pain/Pressure: Present     Pelvic:  Cervical exam deferred        Extremities: Normal range of motion.  Edema: None  Mental Status: Normal mood and affect. Normal behavior. Normal judgment and thought content.   Urinalysis:      Assessment and Plan:  Pregnancy: [name] at [redacted]w[redacted]d  1. Supervision of high risk pregnancy, antepartum Baby moving well. Fundal height greater than dates however had an [place] 2 weeks ago and baby was appropriate size and appropriate  AFI  2. Pelvic pain affecting pregnancy in third trimester, antepartum Has appointment tomorrow for PT.  Has pubic symphysis pain especially when turning over in bed or getting into a car.  Is somewhat better as she is not standing all day at work.  3. Language barrier Interpreter present  Preterm labor symptoms and general obstetric precautions including but not limited to vaginal bleeding, contractions, leaking of fluid and fetal movement were reviewed in detail with the patient. Please refer to After Visit Summary for other counseling recommendations.  Return in about 23 days (around 01/27/2020) for in person ROB with vaginal swabs.  Nolene Bernheim, RN, MSN, NP-BC Nurse Practitioner, [facility] for [employer], [facility] 01/04/2020 3:12 PM

## 2020-01-04 NOTE — Patient Instructions (Signed)
  To have your baby:  [facility] [address] [place], [place] Entrance C

## 2020-01-04 NOTE — Progress Notes (Signed)
Kristine Nunez in [place] Swahili interpreter helped with visit

## 2020-01-05 ENCOUNTER — Ambulatory Visit: Payer: Medicaid Other | Admitting: Physical Therapy

## 2020-01-05 ENCOUNTER — Other Ambulatory Visit: Payer: Self-pay

## 2020-01-05 ENCOUNTER — Encounter: Payer: Self-pay | Admitting: Physical Therapy

## 2020-01-05 DIAGNOSIS — R1032 Left lower quadrant pain: Secondary | ICD-10-CM

## 2020-01-05 DIAGNOSIS — M6281 Muscle weakness (generalized): Secondary | ICD-10-CM | POA: Diagnosis present

## 2020-01-05 DIAGNOSIS — R252 Cramp and spasm: Secondary | ICD-10-CM

## 2020-01-05 NOTE — Therapy (Signed)
[facility] [address] [place], [place], [address] Phone: [phone]   Fax:  [phone]  Physical Therapy Evaluation  Patient Details  Name: Kristine Nunez MRN: [id number] Date of Birth: May 01, 1991 Referring Provider (PT): Dr. Earlie Server   Encounter Date: 01/05/2020  PT End of Session - 01/05/20 1406    Visit Number  1    Date for PT Re-Evaluation  03/01/20    Authorization Type  Medicaid    PT Start Time  1400    PT Stop Time  1435    PT Time Calculation (min)  35 min    Activity Tolerance  Patient tolerated treatment well    Behavior During Therapy  [facility] for tasks assessed/performed       Past Medical History:  Diagnosis Date  . Cholecystitis 2019  . Pyelonephritis 02/05/2019    Past Surgical History:  Procedure Laterality Date  . CESAREAN SECTION     x1  . CHOLECYSTECTOMY N/A 01/28/2018   Procedure: LAPAROSCOPIC CHOLECYSTECTOMY WITH INTRAOPERATIVE CHOLANGIOGRAM;  Surgeon: Donnie Mesa, MD;  Location: [facility];  Service: General;  Laterality: N/A;  . CHOLECYSTECTOMY  01/28/2018    There were no vitals filed for this visit.   Subjective Assessment - 01/05/20 1407    Subjective  Pain started 1 months ago. Pain started at work. She had trouble moving the left leg in the left groin. When patient sit it will be pain full to stand.    Currently in Pain?  Yes    Pain Score  5     Pain Location  Groin    Pain Orientation  Left    Pain Descriptors / Indicators  Discomfort;Spasm    Pain Type  Acute pain    Pain Onset  More than a month ago    Pain Frequency  Intermittent    Aggravating Factors   sitting going into standing, driving and get out of the car, layin gon the left side    Pain Relieving Factors  sitting         [facility] PT Assessment - 01/05/20 0001      Assessment   Medical Diagnosis  O09.90 Supervision of hith risk pregnancy, antepartum; O26.893,R10.2 Pelvic pain affecting pregnancy in  third trimester, antepartum    Referring Provider (PT)  Dr. Earlie Server    Onset Date/Surgical Date  12/05/19    Prior Therapy  none      Precautions   Precautions  Other (comment)    Precaution Comments  pregnant, due 02/17/2020      Restrictions   Weight Bearing Restrictions  No      Balance Screen   Has the patient fallen in the past 6 months  No    Has the patient had a decrease in activity level because of a fear of falling?   No    Is the patient reluctant to leave their home because of a fear of falling?   No      Home Film/video editor residence      Prior Function   Level of Independence  Independent    Vocation  Full time employment    Vocation Requirements  standing 8 hours      Cognition   Overall Cognitive Status  Within Functional Limits for tasks assessed   needs interpreter     Posture/Postural Control   Posture/Postural Control  Postural limitations    Posture Comments  pregnancy posture  ROM / Strength   AROM / PROM / Strength  AROM;PROM;Strength      AROM   Overall AROM Comments  lumbar ROM limited due to pregnancy and short stature      Strength   Right Hip Flexion  3+/5    Left Hip Flexion  3+/5   painfull   Left Hip ABduction  3-/5      Palpation   SI assessment   left ilium is rotated posteriorly    Palpation comment  tenderness located in left gluteus medius, left Sacral sulcus and tightness in the left quadratus, left groin      Special Tests    Special Tests  Sacrolliac Tests    Sacroiliac Tests   Pelvic Compression      Pelvic Compression   Findings  Positive    Side  Left    comment  pain                Objective measurements completed on examination: See above findings.    Pelvic Floor Special Questions - 01/05/20 0001    Prior Pregnancies  Yes    Number of Pregnancies  4    Number of C-Sections  1    Number of Vaginal Deliveries  2       [facility] Adult PT Treatment/Exercise -  01/05/20 0001      Therapeutic Activites    Therapeutic Activities  Other Therapeutic Activities    Other Therapeutic Activities  laying on side with pillow under the abdomen and between her knees and feet      Manual Therapy   Manual Therapy  Muscle Energy Technique      Prosthetics   Prosthetic Care Comments   to correct left ilium               PT Short Term Goals - 01/05/20 1445      PT SHORT TERM GOAL #1   Title  independent with initial HEP    Baseline  not educated yet    Time  4    Period  Weeks    Status  New    Target Date  02/02/20        PT Long Term Goals - 01/05/20 1445      PT LONG TERM GOAL #1   Title  independent with advanced HEP    Baseline  not educated yet    Time  8    Period  Weeks    Status  New    Target Date  03/01/20      PT LONG TERM GOAL #2   Title  able to stand at work with pain level </= 2/10 and place full weight on left leg    Baseline  pain level 5/10, unable to place full weight on left leg    Time  8    Period  Weeks    Status  New    Target Date  03/01/20      PT LONG TERM GOAL #3   Title  able to go from sit to stand with correct body mechanics with pain level </= 2/10    Baseline  pain level 5/10    Time  8    Period  Weeks    Status  New    Target Date  03/01/20      PT LONG TERM GOAL #4   Title  able to get in and out of the car with pain level </= 2/10  due to pelvis in correct alignment    Baseline  pain level 5/10, left ilium rotated posteriorly    Time  8    Period  Weeks    Status  New    Target Date  03/01/20             Plan - 01/05/20 1432    Clinical Impression Statement  Patient is a 29 year old female with pelvic pain for the last month with sudden onset. Patient is presently [redacted] weeks along in her prenancy and due on 02/17/2020. Patient reports her pain is intermittent at level 5/10 when she is sitting and goes into standing, laying on her left side, and standing. Patient left ilium is  rotated posteriorly. Patient has positive SI compression test on the left. Patient has tenderness located on the left gluteus medius and left groin. Patient has tightness in the left quadratus. Patient has weakness in left hip averaging 3/5. Patient has a history of 1 c-section and 2 vaginal births. Patient job consists with her standing for 8 hours making it difficult with the pain. Patient posture is a pregnant posture and her abdomen is large for her short stature and short waist. Patient will benefit from skilled therapy to reduce her pain, go over ways to manage pain at home, understand differnet ways to give birht to reduce her left groin pain.    Personal Factors and Comorbidities  Comorbidity 1;Fitness;Social Background    Comorbidities  [redacted] weeks pregnant, language barrier,    Examination-Activity Limitations  Bed Mobility;Stand;Caring for Others;Locomotion Level;Transfers    Stability/Clinical Decision Making  Evolving/Moderate complexity    Clinical Decision Making  Low    Rehab Potential  Good    PT Frequency  1x / week    PT Duration  8 weeks    PT Treatment/Interventions  Cryotherapy;Moist Heat;Neuromuscular re-education;Therapeutic exercise;Therapeutic activities;Patient/family education;Manual techniques;Taping    PT Next Visit Plan  check to see if pelvis is in correct alignment, hip stretches, soft tissue work, Estate manager/land agent with daily tasks, birthing postures to reduce pain    Consulted and Agree with Plan of Care  Patient;Other (Comment)   interpreter      Patient will benefit from skilled therapeutic intervention in order to improve the following deficits and impairments:  Decreased range of motion, Increased fascial restricitons, Increased muscle spasms, Pain, Decreased strength, Decreased mobility  Visit Diagnosis: Groin pain, left - Plan: PT plan of care cert/re-cert  Cramp and spasm - Plan: PT plan of care cert/re-cert  Muscle weakness (generalized) - Plan: PT plan of  care cert/re-cert     Problem List Patient Active Problem List   Diagnosis Date Noted  . Supervision of high risk pregnancy, antepartum 11/02/2019  . Language barrier 11/02/2019  . History of C-section 11/02/2019  . Obesity in pregnancy 11/02/2019  . Anemia affecting pregnancy 11/02/2019  . Gestational proteinuria, antepartum 11/02/2019    Eulis Foster, PT 01/05/20 2:51 PM   [facility] [address] [place], [place], [address] Phone: [phone]   Fax:  [phone]  Name: Kristine Nunez MRN: [id number] Date of Birth: 10-03-1991

## 2020-01-07 DIAGNOSIS — Z029 Encounter for administrative examinations, unspecified: Secondary | ICD-10-CM

## 2020-01-13 ENCOUNTER — Ambulatory Visit: Payer: Medicaid Other | Admitting: Physical Therapy

## 2020-01-13 ENCOUNTER — Other Ambulatory Visit: Payer: Self-pay

## 2020-01-13 DIAGNOSIS — R252 Cramp and spasm: Secondary | ICD-10-CM

## 2020-01-13 DIAGNOSIS — R1032 Left lower quadrant pain: Secondary | ICD-10-CM

## 2020-01-13 DIAGNOSIS — M6281 Muscle weakness (generalized): Secondary | ICD-10-CM

## 2020-01-13 NOTE — Therapy (Addendum)
[facility] [address], [place], [place], [address] Phone: [phone]   Fax:  [phone]  Physical Therapy Treatment  Patient Details  Name: Kristine Nunez MRN: [id number] Date of Birth: Oct 13, 1991 Referring Provider (PT): Dr. Earlie Server   Encounter Date: 01/13/2020  PT End of Session - 01/13/20 1527    Visit Number  2    Date for PT Re-Evaluation  03/01/20    Authorization Type  Medicaid    PT Start Time  1445    PT Stop Time  1528    PT Time Calculation (min)  43 min    Activity Tolerance  Patient tolerated treatment well;Patient limited by fatigue    Behavior During Therapy  [facility] for tasks assessed/performed       Past Medical History:  Diagnosis Date  . Cholecystitis 2019  . Pyelonephritis 02/05/2019    Past Surgical History:  Procedure Laterality Date  . CESAREAN SECTION     x1  . CHOLECYSTECTOMY N/A 01/28/2018   Procedure: LAPAROSCOPIC CHOLECYSTECTOMY WITH INTRAOPERATIVE CHOLANGIOGRAM;  Surgeon: Donnie Mesa, MD;  Location: [facility];  Service: General;  Laterality: N/A;  . CHOLECYSTECTOMY  01/28/2018    There were no vitals filed for this visit.  Subjective Assessment - 01/13/20 1525    Subjective  Pt reports having no pain since her last session. She will call if she wants to cancel her next appt. Denies leg pain.    Patient is accompained by:  Interpreter    Currently in Pain?  No/denies    Multiple Pain Sites  No                       [facility] Adult PT Treatment/Exercise - 01/13/20 0001      Therapeutic Activites    Therapeutic Activities  Other Therapeutic Activities    Other Therapeutic Activities  Positions  to get the weight of the baby off her back, birthing positions, review of sleeping positions that are supportive in nature, Cat/Camel exercise to get her back moving.       Manual Therapy   Manual therapy comments  Pelvis appears level, soft tissues feel supple at  rest and no pain or tenderness along her LT side.               PT Short Term Goals - 01/05/20 1445      PT SHORT TERM GOAL #1   Title  independent with initial HEP    Baseline  not educated yet    Time  4    Period  Weeks    Status  New    Target Date  02/02/20        PT Long Term Goals - 01/05/20 1445      PT LONG TERM GOAL #1   Title  independent with advanced HEP    Baseline  not educated yet    Time  8    Period  Weeks    Status  New    Target Date  03/01/20      PT LONG TERM GOAL #2   Title  able to stand at work with pain level </= 2/10 and place full weight on left leg    Baseline  pain level 5/10, unable to place full weight on left leg    Time  8    Period  Weeks    Status  New    Target Date  03/01/20  PT LONG TERM GOAL #3   Title  able to go from sit to stand with correct body mechanics with pain level </= 2/10    Baseline  pain level 5/10    Time  8    Period  Weeks    Status  New    Target Date  03/01/20      PT LONG TERM GOAL #4   Title  able to get in and out of the car with pain level </= 2/10 due to pelvis in correct alignment    Baseline  pain level 5/10, left ilium rotated posteriorly    Time  8    Period  Weeks    Status  New    Target Date  03/01/20            Plan - 01/13/20 1508    Clinical Impression Statement  Pt arrives with interpreter. Verbally reports no pain since last session. Pt was educated in positions to reduce the load on her back for comfort, birthing positions and we attempted to learn Cat/Camel exercise but pt had great difficulty coordinating the back movements. Pt may not come for her next appointment but she will call us to let us know.    Personal Factors and Comorbidities  Comorbidity 1;Fitness;Social Background    Comorbidities  [redacted] weeks pregnant, language barrier,    Examination-Activity Limitations  Bed Mobility;Stand;Caring for Others;Locomotion Level;Transfers    Stability/Clinical Decision  Making  Evolving/Moderate complexity    Rehab Potential  Good    PT Frequency  1x / week    PT Duration  8 weeks    PT Treatment/Interventions  Cryotherapy;Moist Heat;Neuromuscular re-education;Therapeutic exercise;Therapeutic activities;Patient/family education;Manual techniques;Taping    PT Next Visit Plan  DC or plan for DC.    Consulted and Agree with Plan of Care  Patient;Other (Comment)       Patient will benefit from skilled therapeutic intervention in order to improve the following deficits and impairments:  Decreased range of motion, Increased fascial restricitons, Increased muscle spasms, Pain, Decreased strength, Decreased mobility  Visit Diagnosis: Cramp and spasm  Muscle weakness (generalized)  Groin pain, left     Problem List Patient Active Problem List   Diagnosis Date Noted  . Supervision of high risk pregnancy, antepartum 11/02/2019  . Language barrier 11/02/2019  . History of C-section 11/02/2019  . Obesity in pregnancy 11/02/2019  . Anemia affecting pregnancy 11/02/2019  . Gestational proteinuria, antepartum 11/02/2019    Amirr Achord, PTA 01/13/2020, 5:02 PM  [facility] [address], [place], [place], [address] Phone: [phone]   Fax:  [phone]  Name: Kristine Nunez MRN: [id number] Date of Birth: February 04, 1991  PHYSICAL THERAPY DISCHARGE SUMMARY  Visits from Start of Care: 2  Current functional level related to goals / functional outcomes: See above. Patient called today to cancel her appointments and be discharged.    Remaining deficits: See above.    Education / Equipment: HEP Plan: Patient agrees to discharge.  Patient goals were not met. Patient is being discharged due to the patient's request.  Thank you for the referral. Earlie Counts, PT 01/19/20 4:20 PM  ?????

## 2020-01-13 NOTE — Patient Instructions (Signed)
Angry Cat Stretch: Do this when your back feels tight. Or, when you have pain to get the baby's weight off your back.   Tuck chin and tighten stomach, arching back. Repeat __5-10__ times per set.  Do _1-2___ sessions per day. Child Pose  Remember to put your knees wide. Sitting on knees, fold body over legs and relax head and arms on floor. Hold for _20___ breaths.  Do 3 reps.  1-2 times a day.  Copyright  VHI. All rights reserved.  Side Waist Stretch from Child's Pose  From child's pose, walk hands to left. Reach right hand out on diagonal. Reach hips back toward heels making a C with torso. Breathe into right side waist. Hold for _20___ breaths. Repeat _3___ times each side.  1-2 times a day.  Copyright  VHI. All rights reserved.

## 2020-01-20 ENCOUNTER — Encounter: Payer: Medicaid Other | Admitting: Physical Therapy

## 2020-01-27 ENCOUNTER — Encounter: Payer: Medicaid Other | Admitting: Physical Therapy

## 2020-01-29 ENCOUNTER — Encounter: Payer: Self-pay | Admitting: Nurse Practitioner

## 2020-01-29 ENCOUNTER — Other Ambulatory Visit (HOSPITAL_COMMUNITY)
Admission: RE | Admit: 2020-01-29 | Discharge: 2020-01-29 | Disposition: A | Discharge status: Home / Self Care | Payer: Medicaid Other | Source: Ambulatory Visit | Attending: Nurse Practitioner | Admitting: Nurse Practitioner

## 2020-01-29 ENCOUNTER — Other Ambulatory Visit: Payer: Self-pay

## 2020-01-29 ENCOUNTER — Ambulatory Visit (INDEPENDENT_AMBULATORY_CARE_PROVIDER_SITE_OTHER): Payer: Medicaid Other | Admitting: Nurse Practitioner

## 2020-01-29 VITALS — BP 124/85 | HR 73 | Wt 194.9 lb

## 2020-01-29 DIAGNOSIS — Z789 Other specified health status: Secondary | ICD-10-CM

## 2020-01-29 DIAGNOSIS — O0990 Supervision of high risk pregnancy, unspecified, unspecified trimester: Secondary | ICD-10-CM | POA: Diagnosis present

## 2020-01-29 DIAGNOSIS — Z3A37 37 weeks gestation of pregnancy: Secondary | ICD-10-CM

## 2020-01-29 DIAGNOSIS — R102 Pelvic and perineal pain: Secondary | ICD-10-CM

## 2020-01-29 DIAGNOSIS — O26893 Other specified pregnancy related conditions, third trimester: Secondary | ICD-10-CM

## 2020-01-29 NOTE — Progress Notes (Signed)
    Subjective:  Kristine Nunez is a 29 y.o. [name] at [redacted]w[redacted]d being seen today for ongoing prenatal care.  She is currently monitored for the following issues for this high-risk pregnancy and has Supervision of high risk pregnancy, antepartum; Language barrier; History of C-section; Obesity in pregnancy; and Gestational proteinuria, antepartum on their problem list.  Patient reports no complaints.  Reports no pain in her groin.  Stopped going to PT.  Wants FMLA papers - advised to check at the front desk.  Contractions: Irritability. Vag. Bleeding: None.  Movement: Present. Denies leaking of fluid.   The following portions of the patient's history were reviewed and updated as appropriate: allergies, current medications, past family history, past medical history, past social history, past surgical history and problem list. Problem list updated.  Objective:   Vitals:   01/29/20 1107  BP: 124/85  Pulse: 73  Weight: 194 lb 14.4 oz (88.4 kg)    Fetal Status: Fetal Heart Rate (bpm): 135 Fundal Height: 38 cm Movement: Present     General:  Alert, oriented and cooperative. Patient is in no acute distress.  Skin: Skin is warm and dry. No rash noted.   Cardiovascular: Normal heart rate noted  Respiratory: Normal respiratory effort, no problems with respiration noted  Abdomen: Soft, gravid, appropriate for gestational age. Pain/Pressure: Absent     Pelvic:  Cervical exam deferred  Vaginal swabs done.        Extremities: Normal range of motion.  Edema: Trace  Mental Status: Normal mood and affect. Normal behavior. Normal judgment and thought content.   Urinalysis:      Assessment and Plan:  Pregnancy: [name] at [redacted]w[redacted]d  1. Supervision of high risk pregnancy, antepartum Some swelling in ankles noted.  Denies any headaches. Is checking BPs at home but is not keeping a written record or entering into [employer].  - Culture, beta strep (group b only) - GC/Chlamydia probe amp ([place])not  at [facility]  2. Pelvic pain affecting pregnancy in third trimester, antepartum Pain is improved.  Not going to PT anymore.  3. Language barrier In person interpreter present and conversing with client although sometimes it is difficult to know if client really understands.  Term labor symptoms and general obstetric precautions including but not limited to vaginal bleeding, contractions, leaking of fluid and fetal movement were reviewed in detail with the patient. Please refer to After Visit Summary for other counseling recommendations.  Return in about 1 week (around 02/05/2020) for in person ROB.  Nolene Bernheim, RN, MSN, NP-BC Nurse Practitioner, [facility] for [employer], [facility] 01/29/2020 11:43 AM

## 2020-02-01 ENCOUNTER — Encounter: Payer: Self-pay | Admitting: Nurse Practitioner

## 2020-02-01 ENCOUNTER — Telehealth: Payer: Self-pay | Admitting: *Deleted

## 2020-02-01 DIAGNOSIS — O99820 Streptococcus B carrier state complicating pregnancy: Secondary | ICD-10-CM | POA: Insufficient documentation

## 2020-02-01 LAB — GC/CHLAMYDIA PROBE AMP (~~LOC~~) NOT AT ARMC
Chlamydia: NEGATIVE
Comment: NEGATIVE
Comment: NORMAL
Neisseria Gonorrhea: NEGATIVE

## 2020-02-01 LAB — CULTURE, BETA STREP (GROUP B ONLY): Strep Gp B Culture: POSITIVE — AB

## 2020-02-01 NOTE — Telephone Encounter (Signed)
VM message left by Meghan @ [employer]. She stated that disability papers have been received for this pt and that EDD is 02/17/20. 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 I returned the call and was informed that they had already spoken with a staff member in our office and have received the information needed.

## 2020-02-12 ENCOUNTER — Ambulatory Visit (INDEPENDENT_AMBULATORY_CARE_PROVIDER_SITE_OTHER): Payer: Medicaid Other | Admitting: Medical

## 2020-02-12 ENCOUNTER — Other Ambulatory Visit: Payer: Self-pay

## 2020-02-12 ENCOUNTER — Encounter: Payer: Self-pay | Admitting: Medical

## 2020-02-12 VITALS — BP 144/95 | HR 64 | Wt 199.7 lb

## 2020-02-12 DIAGNOSIS — O1210 Gestational proteinuria, unspecified trimester: Secondary | ICD-10-CM

## 2020-02-12 DIAGNOSIS — Z98891 History of uterine scar from previous surgery: Secondary | ICD-10-CM | POA: Diagnosis not present

## 2020-02-12 DIAGNOSIS — Z789 Other specified health status: Secondary | ICD-10-CM

## 2020-02-12 DIAGNOSIS — O99820 Streptococcus B carrier state complicating pregnancy: Secondary | ICD-10-CM | POA: Diagnosis not present

## 2020-02-12 DIAGNOSIS — Z3A39 39 weeks gestation of pregnancy: Secondary | ICD-10-CM

## 2020-02-12 DIAGNOSIS — O99210 Obesity complicating pregnancy, unspecified trimester: Secondary | ICD-10-CM

## 2020-02-12 DIAGNOSIS — O99213 Obesity complicating pregnancy, third trimester: Secondary | ICD-10-CM

## 2020-02-12 DIAGNOSIS — O1213 Gestational proteinuria, third trimester: Secondary | ICD-10-CM | POA: Diagnosis not present

## 2020-02-12 DIAGNOSIS — O0990 Supervision of high risk pregnancy, unspecified, unspecified trimester: Secondary | ICD-10-CM

## 2020-02-12 NOTE — Progress Notes (Signed)
   PRENATAL VISIT NOTE  Subjective:  Kristine Nunez is a 29 y.o. [name] at [redacted]w[redacted]d being seen today for ongoing prenatal care.  She is currently monitored for the following issues for this high-risk pregnancy and has Supervision of high risk pregnancy, antepartum; Language barrier; History of C-section; Obesity in pregnancy; Gestational proteinuria, antepartum; and Group B Streptococcus carrier, +RV culture, currently pregnant on their problem list.  Patient reports LE edema. Denies headache, blurred vision or abdominal pain. Contractions: Irritability. Vag. Bleeding: None.  Movement: Present. Denies leaking of fluid.   The following portions of the patient's history were reviewed and updated as appropriate: allergies, current medications, past family history, past medical history, past social history, past surgical history and problem list.   Objective:   Vitals:   02/12/20 1048 02/12/20 1056 02/12/20 1116  BP: (!) 151/89 (!) 142/89 (!) 144/95  Pulse: 62  64  Weight: 199 lb 11.2 oz (90.6 kg)      Fetal Status: Fetal Heart Rate (bpm): 138   Movement: Present     General:  Alert, oriented and cooperative. Patient is in no acute distress.  Skin: Skin is warm and dry. No rash noted.   Cardiovascular: Normal heart rate noted  Respiratory: Normal respiratory effort, no problems with respiration noted  Abdomen: Soft, gravid, appropriate for gestational age.  Pain/Pressure: Absent     Pelvic: Cervical exam deferred        Extremities: Normal range of motion.  Edema: Mild pitting, slight indentation  Mental Status: Normal mood and affect. Normal behavior. Normal judgment and thought content.   Assessment and Plan:  Pregnancy: [name] at [redacted]w[redacted]d 1. Supervision of high risk pregnancy, antepartum  2. Language barrier - Interpreter present  3. History of C-section - VBAC consent signed 11/02/19  4. Obesity in pregnancy  5. Group B Streptococcus carrier, +RV culture, currently pregnant -  Treat in labor, patient aware   6. Gestational proteinuria, antepartum - at previous visit   7. Elevated blood pressure in pregnancy - Mildly elevated blood pressure today x 3 - Patient asymptomatic - Sent to MAU for further evaluation   Term labor symptoms and general obstetric precautions including but not limited to vaginal bleeding, contractions, leaking of fluid and fetal movement were reviewed in detail with the patient. Please refer to After Visit Summary for other counseling recommendations.   Return in about 1 week (around 02/19/2020) for LOB, NST/BPP, In-Person.  Future Appointments  Date Time Provider Department Center  02/18/2020 10:35 AM Rasch, Harolyn Rutherford, NP [facility]  02/18/2020  1:15 PM [facility]    Vonzella Nipple, PA-C

## 2020-02-12 NOTE — Patient Instructions (Addendum)
Fetal Movement Counts °Patient Name: ________________________________________________ Patient Due Date: ____________________ °What is a fetal movement count? ° °A fetal movement count is the number of times that you feel your baby move during a certain amount of time. This may also be called a fetal kick count. A fetal movement count is recommended for every pregnant woman. You may be asked to start counting fetal movements as early as week 28 of your pregnancy. °Pay attention to when your baby is most active. You may notice your baby's sleep and wake cycles. You may also notice things that make your baby move more. You should do a fetal movement count: °· When your baby is normally most active. °· At the same time each day. °A good time to count movements is while you are resting, after having something to eat and drink. °How do I count fetal movements? °1. Find a quiet, comfortable area. Sit, or lie down on your side. °2. Write down the date, the start time and stop time, and the number of movements that you felt between those two times. Take this information with you to your health care visits. °3. Write down your start time when you feel the first movement. °4. Count kicks, flutters, swishes, rolls, and jabs. You should feel at least 10 movements. °5. You may stop counting after you have felt 10 movements, or if you have been counting for 2 hours. Write down the stop time. °6. If you do not feel 10 movements in 2 hours, contact your health care provider for further instructions. Your health care provider may want to do additional tests to assess your baby's well-being. °Contact a health care provider if: °· You feel fewer than 10 movements in 2 hours. °· Your baby is not moving like he or she usually does. °Date: ____________ Start time: ____________ Stop time: ____________ Movements: ____________ °Date: ____________ Start time: ____________ Stop time: ____________ Movements: ____________ °Date: ____________  Start time: ____________ Stop time: ____________ Movements: ____________ °Date: ____________ Start time: ____________ Stop time: ____________ Movements: ____________ °Date: ____________ Start time: ____________ Stop time: ____________ Movements: ____________ °Date: ____________ Start time: ____________ Stop time: ____________ Movements: ____________ °Date: ____________ Start time: ____________ Stop time: ____________ Movements: ____________ °Date: ____________ Start time: ____________ Stop time: ____________ Movements: ____________ °Date: ____________ Start time: ____________ Stop time: ____________ Movements: ____________ °This information is not intended to replace advice given to you by your health care provider. Make sure you discuss any questions you have with your health care provider. °Document Revised: 06/25/2019 Document Reviewed: 06/25/2019 °Elsevier Patient Education © 2020 Elsevier Inc. °Braxton Hicks Contractions °Contractions of the uterus can occur throughout pregnancy, but they are not always a sign that you are in labor. You may have practice contractions called Braxton Hicks contractions. These false labor contractions are sometimes confused with true labor. °What are Braxton Hicks contractions? °Braxton Hicks contractions are tightening movements that occur in the muscles of the uterus before labor. Unlike true labor contractions, these contractions do not result in opening (dilation) and thinning of the cervix. Toward the end of pregnancy (32-34 weeks), Braxton Hicks contractions can happen more often and may become stronger. These contractions are sometimes difficult to tell apart from true labor because they can be very uncomfortable. You should not feel embarrassed if you go to the hospital with false labor. °Sometimes, the only way to tell if you are in true labor is for your health care provider to look for changes in the cervix. The health care provider   will do a physical exam and may  monitor your contractions. If you are not in true labor, the exam should show that your cervix is not dilating and your water has not broken. °If there are no other health problems associated with your pregnancy, it is completely safe for you to be sent home with false labor. You may continue to have Braxton Hicks contractions until you go into true labor. °How to tell the difference between true labor and false labor °True labor °· Contractions last 30-70 seconds. °· Contractions become very regular. °· Discomfort is usually felt in the top of the uterus, and it spreads to the lower abdomen and low back. °· Contractions do not go away with walking. °· Contractions usually become more intense and increase in frequency. °· The cervix dilates and gets thinner. °False labor °· Contractions are usually shorter and not as strong as true labor contractions. °· Contractions are usually irregular. °· Contractions are often felt in the front of the lower abdomen and in the groin. °· Contractions may go away when you walk around or change positions while lying down. °· Contractions get weaker and are shorter-lasting as time goes on. °· The cervix usually does not dilate or become thin. °Follow these instructions at home: ° °· Take over-the-counter and prescription medicines only as told by your health care provider. °· Keep up with your usual exercises and follow other instructions from your health care provider. °· Eat and drink lightly if you think you are going into labor. °· If Braxton Hicks contractions are making you uncomfortable: °? Change your position from lying down or resting to walking, or change from walking to resting. °? Sit and rest in a tub of warm water. °? Drink enough fluid to keep your urine pale yellow. Dehydration may cause these contractions. °? Do slow and deep breathing several times an hour. °· Keep all follow-up prenatal visits as told by your health care provider. This is important. °Contact a  health care provider if: °· You have a fever. °· You have continuous pain in your abdomen. °Get help right away if: °· Your contractions become stronger, more regular, and closer together. °· You have fluid leaking or gushing from your vagina. °· You pass blood-tinged mucus (bloody show). °· You have bleeding from your vagina. °· You have low back pain that you never had before. °· You feel your baby’s head pushing down and causing pelvic pressure. °· Your baby is not moving inside you as much as it used to. °Summary °· Contractions that occur before labor are called Braxton Hicks contractions, false labor, or practice contractions. °· Braxton Hicks contractions are usually shorter, weaker, farther apart, and less regular than true labor contractions. True labor contractions usually become progressively stronger and regular, and they become more frequent. °· Manage discomfort from Braxton Hicks contractions by changing position, resting in a warm bath, drinking plenty of water, or practicing deep breathing. °This information is not intended to replace advice given to you by your health care provider. Make sure you discuss any questions you have with your health care provider. °Document Revised: 10/18/2017 Document Reviewed: 03/21/2017 °Elsevier Patient Education © 2020 Elsevier Inc. ° °ARE[facility]ATR[facility]ILY PRACTICE PHYSICI[facility] ° °[place] ([address]) °• [facility] °o Chambliss, MD; Eniola, MD; Hale, MD; Hensel, MD; McDiarmid, MD; McIntyer, MD; Neal, MD; Walden, MD °o [address] [place], [place] [address] °o [phone] °o Mon-Fri 8:30-12:30, 1:30-5:00 °o Providers come to see babies   at [facility] °o Accepting Medicaid °• [facility] °o Limited providers who accept newborns: Koirala, MD; Morrow, MD; Wolters, MD °o [address], [place], [place] [address] °o [phone] °o Mon-Fri 8:00-5:30 °o Babies seen by providers at  [facility] °o Does NOT accept Medicaid °o Please call early in hospitalization for appointment (limited availability)  °• [facility] °o Mulberry, MD °o [address] [place], [place] [address] °o [phone] °o Mon, Tue, Thur, Fri 8:30-5:00, Wed 10:00-7:00 (closed 1-2pm) °o Babies seen by [facility] providers °o Accepting Medicaid °• [facility] °o Rubin, MD °o [address], [place], [place] [address] °o [phone] °o Mon-Fri 8:30-5:00, Sat 8:30-12:00 °o Provider comes to see babies at [facility] °o Accepting Medicaid °o Must have been referred from current patients or contacted office prior to delivery °• [facility] ([facility]) °o Brown, MD; Chandler, MD; Ettefagh, MD; Grant, MD; Lester, MD; McCormick, MD; McQueen, MD; Prose, MD; Simha, MD; Stanley, MD; Stryffeler, NP; Tebben, NP °o [address], [place], [place] [address] °o [phone] °o Mon, Tue, Thur, Fri 8:30-5:30, Wed 9:30-5:30, Sat 8:30-12:30 °o Babies seen by [facility] providers °o Accepting Medicaid °o Only accepting infants of first-time parents or siblings of current patients °o Hospital discharge coordinator will make follow-up appointment °• Jack Amos °o [address], [place], [place]  [address] °o [phone]   Fax - [phone] °• [facility] °o [address], [place], [place]  [address] °o Phone - [phone]   Fax - [phone] °• Shilpa Gosrani °o [address], [place], [place]  [address] °o [phone] ° °[place] ([address]) °• [facility] °o Bates, MD; Brassfield, MD; Cooper, Cox, MD; MD; Davis, MD; Dovico, MD; Ettefaugh, MD; Little, MD; Lowe, MD; Keiffer, MD; Melvin, MD; Sumner, MD; [place], MD °o [address], [place], [place] [address] °o [phone] °o Mon-Fri 8:30-5:00 (extended evenings Mon-Thur as needed), Sat-Sun  10:00-1:00 °o Providers come to see babies at [facility] °o Accepting Medicaid for families of first-time babies and families with all children in the household age 3 and under. Must register with office prior to making appointment (M-F only). °• [facility] °o Henson, NP; Knapp, MD; Lalonde, MD; Tysinger, PA °o [address] [place], [place] [address] °o [phone] °o Mon-Fri 8:00-5:00 °o Babies seen by providers at [facility] °o Does NOT accept Medicaid/Commercial Insurance Only °• [facility] [place] ([facility]  °o Artis, MD; Barnes, MD; Bratton, MD; Coccaro, MD; Lockett Gardner, MD; Kramer, MD; Marshall, MD; Netherton, MD; Poleto, MD; Skinner, MD °o [address] [place], [place] [address] °o [phone] °o Mon-Fri 8:30-5:30, Sat (Oct.-Mar.) 9:00-1:00 °o Babies seen by providers at [facility] °o Accepting Medicaid ° °[place] ([address]) °• [facility] °o Reid, MD; Warner, MD °o [address], [place], [place] [address] °o [phone] °o Mon-Fri 8:30-5:00, Sat 8:30-12:00 °o Providers come to see babies at [facility] °o Does NOT accept Medicaid °• [facility] °o Becker, PA; Hagler, MD; Scifres, PA; Sun, MD; Swayne, MD °o [address], [place],  [address] °o [phone] °o Mon-Fri 8:00-5:00 °o Babies seen by providers at [facility] °o Does NOT accept Medicaid °o Only accepting babies of parents who are patients °o Please call   early in hospitalization for appointment (limited availability) °• [facility] °o Clark, MD; Frye, MD; Kelleher, MD; Mack, NP; Miller, MD; O'Keller, MD; Patterson, NP; Pudlo, MD; Puzio, MD; Thomas, MD; Tucker, MD; Twiselton, MD °o [address], [place], [place] [address] °o [phone] °o Mon-Fri 8:00-5:00, Sat 9:00-12:00 °o Providers come to see babies at [facility] °o Does NOT accept  Medicaid ° °[place] ([address]) °• [facility] °o Limited providers accepting new patients: Brake, NP; Wharton, PA °o [address], [place], [place] [address] °o [phone] °o Mon-Fri 8:00-5:00 °o Babies seen by providers at [facility] °o Does NOT accept Medicaid °o Only accepting babies of parents who are patients °o Please call early in hospitalization for appointment (limited availability) °• [facility] °o Gay, MD; Quinlan, MD °o [address] [place], [place] [address] °o [phone] (press 1 to schedule appointment) °o Mon-Fri 8:00-5:00 °o Providers come to see babies at [facility] °o Does NOT accept Medicaid °• [facility] °o Mazer, MD °o [address] [place], [place] [address] °o [phone] °o Mon-Fri 8:30-5:00 (lunch 12:30-1:00), extended hours by appointment only Wed 5:00-6:30 °o Babies seen by [facility] providers °o Accepting Medicaid °• [facility] °o Banks, MD; Jordan, MD; Koberlein, MD °o [address], [place], [place] [address] °o [phone] °o Mon-Fri 8:00-5:00 °o Babies seen by [facility] providers °o Does NOT accept Medicaid °• [facility] °o Parker, MD; Hunter, MD; Wallace, DO °o [address] [place], [place] [address] °o [phone] °o Mon-Fri 8:00-5:00 °o Babies seen by [facility] providers °o Does NOT accept Medicaid °• [facility] °o Brandon, PA; Brecken, PA; Christy, NP; Dees, MD; DeClaire, MD; DeWeese, MD; Hansen, NP; Mills, NP; Parrish, NP; Smoot, NP; Summer, MD; Vapne, MD °o [address] [place], [place] [address] °o [phone] °o Mon-Fri 8:30-5:00, Sat 10:00-1:00 °o Providers come to see babies at [facility] °o Does NOT accept Medicaid °o Free prenatal information session Tuesdays at 4:45pm °• [facility] °o Bouska, MD; Gordon, PA; Jeffery, PA; Weber, PA °o [address] [place] [address] °o [phone] °o Mon-Fri 7:30-5:30 °o Babies seen by [facility] providers °• [facility] °o [address], [place], [place]  [address] °o [phone]   Fax - [phone] ° °[place] ([address]) °• [facility] °o Reese, MD °o [address] [place], [place] [address] °o [phone] °o Mon-Thur 8:00-6:00 °o Providers come to see babies at [facility] °o Accepting Medicaid °• [facility] °o Anderson, NP; Badger, MD; Beal, PA; Spencer, PA °o [address] [place], [place] [address] °o [phone] °o Mon-Thur 7:30-7:30, Fri 7:30-4:30 °o Babies seen by [facility] providers °o Accepting Medicaid °• [facility] °o Agbuya, MD; Klett, NP; Romgoolam, MD °o [address], [place], [place] [address] °o [phone] °o Mon-Fri 8:30-5:00, Sat 8:30-12:00 °o Providers come to see babies at [facility] °o Accepting Medicaid °o Must have “Meet & Greet” appointment at office prior to delivery °• [facility]  ([facility] °o McCord, MD; [place], MD; Wood, MD °o [address], , [place] [address] °o [phone] °o Mon-Wed 8:00-6:00, Thur-Fri 8:00-5:00, Sat 9:00-12:00 °o Providers come to see babies at [facility] °o Does NOT accept Medicaid °o Only accepting siblings of current patients °• [facility]   °  o [address], [place], [place]  [address] °o [phone]   Fax - [phone] °• [facility] °o [address], [place], [place]  [address] °o [phone]   Fax - [phone] ° °[place] ([address]) °• [facility] °o Cirigliano, DO; Matthews, DO °o [address] [place], [place] [address] °o [phone] °o Mon-Fri 7:00-5:00 °o Babies seen by [facility] providers °o Does NOT  accept Medicaid °• [facility] °o Briscoe, MD; Howley, PA; Moreira, PA °o [address], [place], [place] [address] °o [phone] °o Mon-Fri 8:00-5:00 °o Babies seen by [facility] providers °o Accepting Medicaid °• [facility] °o Boyd, MD; Church, PA; Jones, NP; Osborn, PA °o [address], [place], [place] [address] °o [phone] °o Mon-Fri 8:00-5:00 °o Babies seen by providers at [facility] °o Accepting Medicaid ° °[place] ([address]) °• [facility] [place] °o Wendling, DO °o [address] [place], [place] [address] °o [phone] °o Mon-Fri 8:00-5:00 °o Babies seen by [facility] providers °o Does NOT accept Medicaid °o Limited availability, please call early in hospitalization to schedule follow-up °• [facility] °o Calderon, PA; Cummings, MD; Dillard, MD; Martin, PA; Olson, MD; VanDeven, PA °o [address], [place], [place] [address] °o [phone] °o Mon-Fri 8:30-5:00, Sat 9:00-12:00 °o Babies seen by providers at [facility] °o Accepting Medicaid °o Please register online then schedule online or call office °o [email or web address] °• [facility] °o Hunter, NP; Kumar, MD; Martin Rogers, PA °o [address], [place], [place] [address] °o [phone] °o Mon-Fri 8:00-5:00 °o Babies seen by providers at [facility] °o Accepting Medicaid °• [facility] °o Railroad, MD; Kristi Fleenor, NP; West, MD °o [address], [place], [place] [address] °o [phone] °o Mon-Fri 8:00-5:30, Sat&Sun by appointment (phones open at 8:30) °o Babies seen by [facility] providers °o Accepting Medicaid °o Must be a first-time baby or sibling of current patient °• [facility] [place]  °o [address], [place], [place]  [address] °o [phone]   Fax - [phone] ° °[place] ([address]) °• [facility] [place] [facility] °o Brown, PA; Cowen, PA; Rice, MD; Helton, PA; Spry, MD °o [address] [place],  [address] °o [phone] °o Mon-Thur 8:00-7:00, Fri 8:00-5:00, Sat 8:00-12:00, Sun 9:00-12:00 °o Babies seen by [facility] providers °o Accepting Medicaid °• [facility] [place] °o Coe-Goins, MD; Marshall, MD; Pierre-Louis, MD °o [address] [place] [address], [place],  [address] °o [phone] °o Mon-Thur 8:00-5:00 °o Babies seen by providers at [facility] °o Accepting Medicaid °• [facility] °o Bratton, MD; Coe-Goins, MD; Hayes, MD; Lewis, MD; List, MD; Lott, MD; Marshall, MD; [place], MD; O'Neal, MD; Pierre-Louis, MD; Pitonzo, MD; Scholer, MD; Spangle, MD °o [address] [place],  [address] °o [phone] °o Mon-Fri 8:00-5:30, Sat (Oct.-Mar.) 9:00-1:00 °o Babies seen by providers at [facility] °o Accepting Medicaid °o Must fill out new patient packet, available online at [email or web address] °• [facility] ([facility] °o Friddle, NP; Harris, NP; Kelly, NP; Logan,   MD; Melvin, PA; Poth, MD; Ramadoss, MD; Stanton, NP °o [address], [place], [place] [address] °o [phone] °o Mon-Thur 8:00-5:30, Fri 8:00-5:00 °o Babies seen by providers at [facility] °o Accepting Medicaid ° °[place] ([address]) °• [facility] °o Dixon, PA; Driftwood, MD; Pickard, MD; Tapia, PA °o [address], [place], [place] [address] °o [phone] °o Mon-Fri 8:00-5:00 °o Babies seen by providers at [facility] °o Accepting Medicaid  ° °[place] ([address]) °• [facility] [place] °o Masneri, DO; Meyers, MD; Nelson, PA °o [address], [place], [place]  [address] °o [phone] °o Mon-Fri 8:00-5:00 °o Babies seen by providers at [facility] °o Does NOT accept Medicaid °o Limited appointment availability, please call early in hospitalization ° °• [facility] [place] °o Kunedd, DO; McGowen, MD °o [address], [place], [place] [address] °o [phone] °o Mon-Fri 8:00-5:00 °o Babies seen by [facility] providers °o Does NOT accept Medicaid °• [facility] [place] °o Cameron, MD; MacDonald, MD; Michaels, PA; Nayak, MD °o [address] [place] [address], [place], [place] [address] °o [phone] °o Mon-Fri 8:00-5:00 °o After hours clinic ([address] [place], [place] [address]) [phone] Mon-Fri 5:00-8:00, Sat 12:00-6:00, Sun 10:00-4:00 °o Babies seen by [facility] providers °o Accepting Medicaid °• [facility] [place] °o [address], [place], [place]  [address] °o [phone]   Fax - [phone] ° °[place] ([address]) °• [facility] [place] [facility] °o Andy, MD °o [address], [place], [place] [address] °o [phone] °o Mon-Fri 8:00-5:00 °o Babies seen by [facility] providers °o Does NOT accept Medicaid °• [facility] [place] ([facility] [place]) °o Eksir, MD °o [address], [place], [place] [address] °o [phone] °o Mon-Thur 8:00-7:00, Fri 8:00-5:00, Sat 8:00-12:00 °o Babies seen by providers at [facility] °o Accepting Medicaid - but does not have vaccinations in office (must be received elsewhere) °o Limited availability, please call early in hospitalization ° °[place] ([address]) °• [facility]  °o Charlene Flemming, MD °o [address], [place] [address] °o [phone]  Fax [phone] ° ° °

## 2020-02-17 ENCOUNTER — Inpatient Hospital Stay (EMERGENCY_DEPARTMENT_HOSPITAL)
Admission: AD | Admit: 2020-02-17 | Discharge: 2020-02-17 | Discharge status: Against Medical Advice | Payer: Medicaid Other | Source: Home / Self Care | Attending: Obstetrics and Gynecology | Admitting: Obstetrics and Gynecology

## 2020-02-17 ENCOUNTER — Other Ambulatory Visit: Payer: Self-pay

## 2020-02-17 ENCOUNTER — Encounter (HOSPITAL_COMMUNITY): Payer: Self-pay | Admitting: Obstetrics and Gynecology

## 2020-02-17 DIAGNOSIS — Z3A40 40 weeks gestation of pregnancy: Secondary | ICD-10-CM

## 2020-02-17 DIAGNOSIS — Z5329 Procedure and treatment not carried out because of patient's decision for other reasons: Secondary | ICD-10-CM

## 2020-02-17 DIAGNOSIS — O1413 Severe pre-eclampsia, third trimester: Secondary | ICD-10-CM

## 2020-02-17 LAB — CBC
HCT: 32.7 % — ABNORMAL LOW (ref 36.0–46.0)
Hemoglobin: 10.5 g/dL — ABNORMAL LOW (ref 12.0–15.0)
MCH: 27.9 pg (ref 26.0–34.0)
MCHC: 32.1 g/dL (ref 30.0–36.0)
MCV: 87 fL (ref 80.0–100.0)
Platelets: 242 10*3/uL (ref 150–400)
RBC: 3.76 MIL/uL — ABNORMAL LOW (ref 3.87–5.11)
RDW: 14.7 % (ref 11.5–15.5)
WBC: 5.8 10*3/uL (ref 4.0–10.5)
nRBC: 0.3 % — ABNORMAL HIGH (ref 0.0–0.2)

## 2020-02-17 LAB — COMPREHENSIVE METABOLIC PANEL
ALT: 11 U/L (ref 0–44)
AST: 18 U/L (ref 15–41)
Albumin: 2.1 g/dL — ABNORMAL LOW (ref 3.5–5.0)
Alkaline Phosphatase: 199 U/L — ABNORMAL HIGH (ref 38–126)
Anion gap: 10 (ref 5–15)
BUN: 5 mg/dL — ABNORMAL LOW (ref 6–20)
CO2: 17 mmol/L — ABNORMAL LOW (ref 22–32)
Calcium: 8.4 mg/dL — ABNORMAL LOW (ref 8.9–10.3)
Chloride: 109 mmol/L (ref 98–111)
Creatinine, Ser: 0.79 mg/dL (ref 0.44–1.00)
GFR calc Af Amer: >60 mL/min (ref >60)
GFR calc non Af Amer: >60 mL/min (ref >60)
Glucose, Bld: 86 mg/dL (ref 70–99)
Potassium: 3.4 mmol/L — ABNORMAL LOW (ref 3.5–5.1)
Sodium: 136 mmol/L (ref 135–145)
Total Bilirubin: 0.3 mg/dL (ref 0.3–1.2)
Total Protein: 5.4 g/dL — ABNORMAL LOW (ref 6.5–8.1)

## 2020-02-17 LAB — URINALYSIS, ROUTINE W REFLEX MICROSCOPIC
Bilirubin Urine: NEGATIVE
Glucose, UA: NEGATIVE mg/dL
Hgb urine dipstick: NEGATIVE
Ketones, ur: NEGATIVE mg/dL
Leukocytes,Ua: NEGATIVE
Nitrite: NEGATIVE
Protein, ur: 100 mg/dL — AB
Specific Gravity, Urine: 1.004 — ABNORMAL LOW (ref 1.005–1.030)
pH: 7 (ref 5.0–8.0)

## 2020-02-17 LAB — PROTEIN / CREATININE RATIO, URINE
Creatinine, Urine: 35.35 mg/dL
Protein Creatinine Ratio: 7.36 mg/mg{Cre} — ABNORMAL HIGH (ref 0.00–0.15)
Total Protein, Urine: 260 mg/dL

## 2020-02-17 LAB — ABO/RH: ABO/RH(D): A POS

## 2020-02-17 MED ORDER — LABETALOL HCL 5 MG/ML IV SOLN
20.0000 mg | Freq: Once | INTRAVENOUS | Status: AC
  Administered 2020-02-17: 19:00:00 20 mg via INTRAVENOUS
  Filled 2020-02-17: qty 4

## 2020-02-17 MED ORDER — LACTATED RINGERS IV SOLN: INTRAVENOUS | Status: DC

## 2020-02-17 NOTE — MAU Note (Signed)
Spoke with patient via remote interpreter ipad and patient stated she needed to go home to speak with her husband concerning the recommendation for admission.  Patient states she has no way to talk with her husband until he gets home from work tonight at 0400.  Judeth Horn, NP spoke with patient via interpreter and reiterated the risks associated with leaving including potential maternal or fetal death.  Patient states she knows the risks and insists she still needs to go home.  She states she will come back when her husband gets off work.  Patient signed the paperwork to leave against medical advice.  Patient ambulatory and in no pain upon leaving.

## 2020-02-17 NOTE — MAU Provider Note (Signed)
Chief Complaint  Patient presents with  . Hypertension     First Provider Initiated Contact with Patient 02/17/20 1826     *video Swahili interpreter used for this encounter*  S: Kayann Richart  is a 29 y.o. y.o. year old G57P3013 female at [redacted]w[redacted]d weeks gestation who presents to MAU with elevated blood pressures. Denies  Hx of hypertension. Current blood pressure medication: none.  Had new onset hypertension in the office on Friday & was instructed to come to MAU for evaluation but was unable to come. Presents today because she thought all she would need is a blood pressure check and then she would be able to go home.   Associated symptoms: denies Headache, denies vision changes, denies epigastric pain Contractions: denies Vaginal bleeding: denies Fetal movement: good  O:  Patient Vitals for the past 24 hrs:  BP Temp Temp src Pulse Resp SpO2 Weight  02/17/20 1921 (!) 159/92 - - 72 17 - -  02/17/20 1910 (!) 159/90 - - 74 19 - -  02/17/20 1845 (!) 175/101 - - 79 - 100 % -  02/17/20 1830 (!) 169/101 - - 78 - 100 % -  02/17/20 1820 (!) 170/97 - - 79 - 100 % -  02/17/20 1752 (!) 149/87 98.3 F (36.8 C) Oral 77 17 100 % -  02/17/20 1747 - - - - - - 92.6 kg   General: NAD Heart: Regular rate Lungs: Normal rate and effort Abd: Soft, NT, Gravid, S=D Extremities: 2+ pitting Pedal edema Neuro: 2+ deep tendon reflexes, No clonus  NST:  Baseline: 130 bpm, Variability: Good {> 6 bpm), Accelerations: Reactive and Decelerations: Absent  Results for orders placed or performed during the hospital encounter of 02/17/20 (from the past 24 hour(s))  Urinalysis, Routine w reflex microscopic     Status: Abnormal   Collection Time: 02/17/20  6:10 PM  Result Value Ref Range   Color, Urine YELLOW YELLOW   APPearance CLEAR CLEAR   Specific Gravity, Urine 1.004 (L) 1.005 - 1.030   pH 7.0 5.0 - 8.0   Glucose, UA NEGATIVE NEGATIVE mg/dL   Hgb urine dipstick NEGATIVE NEGATIVE   Bilirubin Urine NEGATIVE  NEGATIVE   Ketones, ur NEGATIVE NEGATIVE mg/dL   Protein, ur 812 (A) NEGATIVE mg/dL   Nitrite NEGATIVE NEGATIVE   Leukocytes,Ua NEGATIVE NEGATIVE   RBC / HPF 0-5 0 - 5 RBC/hpf   WBC, UA 0-5 0 - 5 WBC/hpf   Bacteria, UA RARE (A) NONE SEEN   Squamous Epithelial / LPF 0-5 0 - 5  CBC     Status: Abnormal   Collection Time: 02/17/20  6:28 PM  Result Value Ref Range   WBC 5.8 4.0 - 10.5 K/uL   RBC 3.76 (L) 3.87 - 5.11 MIL/uL   Hemoglobin 10.5 (L) 12.0 - 15.0 g/dL   HCT 75.1 (L) 70.0 - 17.4 %   MCV 87.0 80.0 - 100.0 fL   MCH 27.9 26.0 - 34.0 pg   MCHC 32.1 30.0 - 36.0 g/dL   RDW 94.4 96.7 - 59.1 %   Platelets 242 150 - 400 K/uL   nRBC 0.3 (H) 0.0 - 0.2 %  Comprehensive metabolic panel     Status: Abnormal   Collection Time: 02/17/20  6:28 PM  Result Value Ref Range   Sodium 136 135 - 145 mmol/L   Potassium 3.4 (L) 3.5 - 5.1 mmol/L   Chloride 109 98 - 111 mmol/L   CO2 17 (L) 22 - 32 mmol/L  Glucose, Bld 86 70 - 99 mg/dL   BUN 5 (L) 6 - 20 mg/dL   Creatinine, Ser 0.79 0.44 - 1.00 mg/dL   Calcium 8.4 (L) 8.9 - 10.3 mg/dL   Total Protein 5.4 (L) 6.5 - 8.1 g/dL   Albumin 2.1 (L) 3.5 - 5.0 g/dL   AST 18 15 - 41 U/L   ALT 11 0 - 44 U/L   Alkaline Phosphatase 199 (H) 38 - 126 U/L   Total Bilirubin 0.3 0.3 - 1.2 mg/dL   GFR calc non Af Amer >60 >60 mL/min   GFR calc Af Amer >60 >60 mL/min   Anion gap 10 5 - 15  Type and screen     Status: None   Collection Time: 02/17/20  6:47 PM  Result Value Ref Range   ABO/RH(D) A POS    Antibody Screen NEG    Sample Expiration      02/20/2020,2359 Performed at [facility], [place] [address] [place], [place] [address]   ABO/Rh     Status: None (Preliminary result)   Collection Time: 02/17/20  6:47 PM  Result Value Ref Range   ABO/RH(D)      A POS Performed at [facility] [address], [place], [place] [address]   Protein / creatinine ratio, urine     Status: Abnormal   Collection Time: 02/17/20  6:55 PM  Result Value Ref  Range   Creatinine, Urine 35.35 mg/dL   Total Protein, Urine 260 mg/dL   Protein Creatinine Ratio 7.36 (H) 0.00 - 0.15 mg/mg[Cre]    MDM Patient with severe range BPs in MAU at term. Needs admission for severe preeclampsia. Delay in administration of IV labetalol due to patient hesitancy. Patient unsure if she will stay for IOL but finally willing to receive IV medication for severe range BPs.   Pt concerned regarding IOL because she's not contracting. Also concerned because her kids are at home & she doesn't have childcare. Her husband works over an hour away.  Discussed with patient the implications of untreated preeclampsia including maternal or fetal death.   Patient ultimately chose to leave against medical advice - signed AMA papers. Swahili interpreter used for these discussions.    A:  1. Severe pre-eclampsia in third trimester   2. [redacted] weeks gestation of pregnancy   3. Left against medical advice    P:  Patient signed out against medical advice. States she will return when her husband gets off work at 4 in the morning. Instructed patient to call 911 for severe headache, visual disturbance, CP, SOB, LOC, or seizure activity.   Jorje Guild, NP 02/17/2020 7:52 PM

## 2020-02-17 NOTE — MAU Note (Signed)
Kristine Nunez is a 29 y.o. at [redacted]w[redacted]d here in MAU reporting: here for a BP evaluation. Was sent over from the office on Friday. Reports seeing spots/flashes, no headache. No RUQ pain. Increased swelling in her feet. No contractions or vaginal bleeding. No LOF. +FM  Onset of complaint: ongoing  Pain score: 0/10  Vitals:   02/17/20 1752  BP: (!) 149/87  Pulse: 77  Resp: 17  Temp: 98.3 F (36.8 C)  SpO2: 100%     FHT: +FM  Lab orders placed from triage: UA

## 2020-02-18 ENCOUNTER — Encounter: Payer: Medicaid Other | Admitting: Obstetrics and Gynecology

## 2020-02-18 ENCOUNTER — Encounter (HOSPITAL_COMMUNITY): Payer: Self-pay | Admitting: Obstetrics and Gynecology

## 2020-02-18 ENCOUNTER — Inpatient Hospital Stay (HOSPITAL_COMMUNITY)
Admission: AD | Admit: 2020-02-18 | Discharge: 2020-02-21 | DRG: 798 | Disposition: A | Discharge status: Home / Self Care | Payer: Medicaid Other | Attending: Family Medicine | Admitting: Family Medicine

## 2020-02-18 ENCOUNTER — Other Ambulatory Visit: Payer: Medicaid Other

## 2020-02-18 DIAGNOSIS — O34219 Maternal care for unspecified type scar from previous cesarean delivery: Secondary | ICD-10-CM | POA: Diagnosis present

## 2020-02-18 DIAGNOSIS — O99214 Obesity complicating childbirth: Secondary | ICD-10-CM | POA: Diagnosis present

## 2020-02-18 DIAGNOSIS — O1414 Severe pre-eclampsia complicating childbirth: Principal | ICD-10-CM | POA: Diagnosis present

## 2020-02-18 DIAGNOSIS — O99824 Streptococcus B carrier state complicating childbirth: Secondary | ICD-10-CM | POA: Diagnosis not present

## 2020-02-18 DIAGNOSIS — Z789 Other specified health status: Secondary | ICD-10-CM

## 2020-02-18 DIAGNOSIS — Z3A40 40 weeks gestation of pregnancy: Secondary | ICD-10-CM

## 2020-02-18 DIAGNOSIS — O99820 Streptococcus B carrier state complicating pregnancy: Secondary | ICD-10-CM

## 2020-02-18 DIAGNOSIS — O1413 Severe pre-eclampsia, third trimester: Secondary | ICD-10-CM | POA: Diagnosis present

## 2020-02-18 DIAGNOSIS — Z98891 History of uterine scar from previous surgery: Secondary | ICD-10-CM

## 2020-02-18 DIAGNOSIS — O9902 Anemia complicating childbirth: Secondary | ICD-10-CM | POA: Diagnosis present

## 2020-02-18 DIAGNOSIS — O1210 Gestational proteinuria, unspecified trimester: Secondary | ICD-10-CM

## 2020-02-18 DIAGNOSIS — D649 Anemia, unspecified: Secondary | ICD-10-CM | POA: Diagnosis present

## 2020-02-18 DIAGNOSIS — O34211 Maternal care for low transverse scar from previous cesarean delivery: Secondary | ICD-10-CM | POA: Diagnosis not present

## 2020-02-18 DIAGNOSIS — O0990 Supervision of high risk pregnancy, unspecified, unspecified trimester: Secondary | ICD-10-CM

## 2020-02-18 DIAGNOSIS — O99210 Obesity complicating pregnancy, unspecified trimester: Secondary | ICD-10-CM

## 2020-02-18 DIAGNOSIS — Z20822 Contact with and (suspected) exposure to covid-19: Secondary | ICD-10-CM | POA: Diagnosis present

## 2020-02-18 LAB — CBC
HCT: 31 % — ABNORMAL LOW (ref 36.0–46.0)
Hemoglobin: 9.8 g/dL — ABNORMAL LOW (ref 12.0–15.0)
MCH: 27.8 pg (ref 26.0–34.0)
MCHC: 31.6 g/dL (ref 30.0–36.0)
MCV: 87.8 fL (ref 80.0–100.0)
Platelets: 234 10*3/uL (ref 150–400)
RBC: 3.53 MIL/uL — ABNORMAL LOW (ref 3.87–5.11)
RDW: 14.9 % (ref 11.5–15.5)
WBC: 5.7 10*3/uL (ref 4.0–10.5)
nRBC: 0.4 % — ABNORMAL HIGH (ref 0.0–0.2)

## 2020-02-18 LAB — COMPREHENSIVE METABOLIC PANEL
ALT: 10 U/L (ref 0–44)
AST: 15 U/L (ref 15–41)
Albumin: 2 g/dL — ABNORMAL LOW (ref 3.5–5.0)
Alkaline Phosphatase: 192 U/L — ABNORMAL HIGH (ref 38–126)
Anion gap: 9 (ref 5–15)
BUN: <5 mg/dL — ABNORMAL LOW (ref 6–20)
CO2: 18 mmol/L — ABNORMAL LOW (ref 22–32)
Calcium: 8.2 mg/dL — ABNORMAL LOW (ref 8.9–10.3)
Chloride: 110 mmol/L (ref 98–111)
Creatinine, Ser: 0.73 mg/dL (ref 0.44–1.00)
GFR calc Af Amer: >60 mL/min (ref >60)
GFR calc non Af Amer: >60 mL/min (ref >60)
Glucose, Bld: 86 mg/dL (ref 70–99)
Potassium: 3.2 mmol/L — ABNORMAL LOW (ref 3.5–5.1)
Sodium: 137 mmol/L (ref 135–145)
Total Bilirubin: 0.3 mg/dL (ref 0.3–1.2)
Total Protein: 5.3 g/dL — ABNORMAL LOW (ref 6.5–8.1)

## 2020-02-18 LAB — SARS CORONAVIRUS 2 (TAT 6-24 HRS): SARS Coronavirus 2: NEGATIVE

## 2020-02-18 LAB — RPR: RPR Ser Ql: NONREACTIVE

## 2020-02-18 MED ORDER — OXYCODONE-ACETAMINOPHEN 5-325 MG PO TABS: 1.0000 | ORAL_TABLET | ORAL | Status: DC | PRN

## 2020-02-18 MED ORDER — LACTATED RINGERS IV SOLN: INTRAVENOUS | Status: DC

## 2020-02-18 MED ORDER — MAGNESIUM SULFATE 40 GM/1000ML IV SOLN
2.0000 g/h | INTRAVENOUS | Status: DC
  Administered 2020-02-18 – 2020-02-19 (×2): 2 g/h via INTRAVENOUS
  Filled 2020-02-18 (×2): qty 1000

## 2020-02-18 MED ORDER — LABETALOL HCL 5 MG/ML IV SOLN: 80.0000 mg | INTRAVENOUS | Status: DC | PRN

## 2020-02-18 MED ORDER — OXYTOCIN BOLUS FROM INFUSION
500.0000 mL | Freq: Once | INTRAVENOUS | Status: AC
  Administered 2020-02-19: 500 mL via INTRAVENOUS

## 2020-02-18 MED ORDER — LABETALOL HCL 5 MG/ML IV SOLN
20.0000 mg | INTRAVENOUS | Status: DC | PRN
  Administered 2020-02-18 – 2020-02-19 (×2): 20 mg via INTRAVENOUS
  Filled 2020-02-18 (×3): qty 4

## 2020-02-18 MED ORDER — SODIUM CHLORIDE 0.9 % IV SOLN
5.0000 10*6.[IU] | Freq: Once | INTRAVENOUS | Status: AC
  Administered 2020-02-18: 5 10*6.[IU] via INTRAVENOUS
  Filled 2020-02-18: qty 5

## 2020-02-18 MED ORDER — OXYTOCIN 40 UNITS IN NORMAL SALINE INFUSION - SIMPLE MED
2.5000 [IU]/h | INTRAVENOUS | Status: DC
  Administered 2020-02-19 (×2): 2.5 [IU]/h via INTRAVENOUS
  Filled 2020-02-18: qty 1000

## 2020-02-18 MED ORDER — TERBUTALINE SULFATE 1 MG/ML IJ SOLN: 0.2500 mg | Freq: Once | INTRAMUSCULAR | Status: DC | PRN

## 2020-02-18 MED ORDER — LIDOCAINE HCL (PF) 1 % IJ SOLN: 30.0000 mL | INTRAMUSCULAR | Status: DC | PRN

## 2020-02-18 MED ORDER — OXYCODONE-ACETAMINOPHEN 5-325 MG PO TABS: 2.0000 | ORAL_TABLET | ORAL | Status: DC | PRN

## 2020-02-18 MED ORDER — ACETAMINOPHEN 325 MG PO TABS: 650.0000 mg | ORAL_TABLET | ORAL | Status: DC | PRN

## 2020-02-18 MED ORDER — OXYTOCIN 40 UNITS IN NORMAL SALINE INFUSION - SIMPLE MED
1.0000 m[IU]/min | INTRAVENOUS | Status: DC
  Administered 2020-02-18: 2 m[IU]/min via INTRAVENOUS
  Filled 2020-02-18: qty 1000

## 2020-02-18 MED ORDER — MAGNESIUM SULFATE BOLUS VIA INFUSION
4.0000 g | Freq: Once | INTRAVENOUS | Status: AC
  Administered 2020-02-18: 4 g via INTRAVENOUS
  Filled 2020-02-18: qty 1000

## 2020-02-18 MED ORDER — LABETALOL HCL 5 MG/ML IV SOLN: 40.0000 mg | INTRAVENOUS | Status: DC | PRN

## 2020-02-18 MED ORDER — PENICILLIN G POT IN DEXTROSE 60000 UNIT/ML IV SOLN
3.0000 10*6.[IU] | INTRAVENOUS | Status: DC
  Administered 2020-02-18 – 2020-02-19 (×4): 3 10*6.[IU] via INTRAVENOUS
  Filled 2020-02-18 (×4): qty 50

## 2020-02-18 MED ORDER — SOD CITRATE-CITRIC ACID 500-334 MG/5ML PO SOLN: 30.0000 mL | ORAL | Status: DC | PRN

## 2020-02-18 MED ORDER — HYDRALAZINE HCL 50 MG PO TABS
25.0000 mg | ORAL_TABLET | Freq: Four times a day (QID) | ORAL | Status: DC | PRN
  Filled 2020-02-18: qty 1

## 2020-02-18 MED ORDER — LACTATED RINGERS IV SOLN: 500.0000 mL | INTRAVENOUS | Status: DC | PRN

## 2020-02-18 MED ORDER — FENTANYL CITRATE (PF) 100 MCG/2ML IJ SOLN
100.0000 ug | INTRAMUSCULAR | Status: DC | PRN
  Administered 2020-02-18 – 2020-02-19 (×5): 100 ug via INTRAVENOUS
  Filled 2020-02-18 (×5): qty 2

## 2020-02-18 MED ORDER — LACTATED RINGERS AMNIOINFUSION
INTRAVENOUS | Status: DC
  Filled 2020-02-18 (×2): qty 1000

## 2020-02-18 MED ORDER — ONDANSETRON HCL 4 MG/2ML IJ SOLN: 4.0000 mg | Freq: Four times a day (QID) | INTRAMUSCULAR | Status: DC | PRN

## 2020-02-18 NOTE — Progress Notes (Signed)
Patient ID: Kristine Nunez, female   DOB: 02-02-1991, 29 y.o.   MRN: [id number] Breathing through contractions Getting Fentanyl for pain  158/91  FHR reassuring UCs every 2-3 min  Dilation: 7 Effacement (%): 80 Station: -2 Presentation: Vertex Exam by:: Dr. Morene Antu  Will continue to observe

## 2020-02-18 NOTE — MAU Note (Signed)
Jessica called, rm changed to 216

## 2020-02-18 NOTE — MAU Note (Signed)
Pt agreeable to admission today.  Explained need for covid testing prior to admit. Process explained to pt, pt then transferred to L&D

## 2020-02-18 NOTE — Progress Notes (Addendum)
Labor Progress Note Kristine Nunez is a 29 y.o. [id number] at [redacted]w[redacted]d presented for IOL for pre-eclampsia.  S: Patient declined AROM.   O:  BP 128/72   Pulse 80   Temp (!) 97.4 F (36.3 C) (Oral)   Resp 18   LMP 05/13/2019 (Approximate)  EFM: 150s, moderate variability, minimal accels present, TOCO every 4 minutes   CVE: Dilation: 5.5 Effacement (%): 70 Station: -3 Presentation: Vertex Exam by:: Dr. Morene Antu   A&P: 29 y.o. I4P3295 [redacted]w[redacted]d for IOL for pre-eclampsia.  #Labor: Foley bulb, Progressing. Reassess for AROM. Continue pitocin. Anticipate VBAC.   #Pain: no epidural  #FWB: Category 1  #GBS positive, on PCN    Loni Abdon, DO 2:38 PM

## 2020-02-18 NOTE — H&P (Signed)
OBSTETRIC ADMISSION HISTORY AND PHYSICAL  Kristine Nunez is a 29 y.o. female [phone] with IUP at [redacted]w[redacted]d by u/s presenting for IOL for Pre-E with severe range pressures. She reports +FMs, No LOF, no VB, no blurry vision, headaches or peripheral edema, and RUQ pain.  She plans on breast and bottle feeding. She request IUD outpatient for birth control. She received her prenatal care at [facility]  Dating: By u/s --->  Estimated Date of Delivery: 02/17/20  Sono:    @[redacted]w[redacted]d , CWD, normal anatomy, cephalic presentation, 1745g, EFW   Prenatal History/Complications: Language barrier - Swahili Hx C section Gestational proteinuria gHTN GBS carrier  Past Medical History: Past Medical History:  Diagnosis Date  . Cholecystitis 2019  . Pyelonephritis 02/05/2019    Past Surgical History: Past Surgical History:  Procedure Laterality Date  . CESAREAN SECTION     x1  . CHOLECYSTECTOMY N/A 01/28/2018   Procedure: LAPAROSCOPIC CHOLECYSTECTOMY WITH INTRAOPERATIVE CHOLANGIOGRAM;  Surgeon: 03/30/2018, MD;  Location: [facility] OR;  Service: General;  Laterality: N/A;  . CHOLECYSTECTOMY  01/28/2018    Obstetrical History: OB History    Gravida  6   Para  4   Term  4   Preterm      AB  1   Living  3     SAB  1   TAB      Ectopic      Multiple      Live Births  3           Social History Social History   Socioeconomic History  . Marital status: Married    Spouse name: Not on file  . Number of children: Not on file  . Years of education: Not on file  . Highest education level: Not on file  Occupational History  . Not on file  Tobacco Use  . Smoking status: Never Smoker  . Smokeless tobacco: Never Used  Substance and Sexual Activity  . Alcohol use: No  . Drug use: No  . Sexual activity: Not Currently  Other Topics Concern  . Not on file  Social History Narrative  . Not on file   Social Determinants of Health   Financial Resource Strain:   . Difficulty of  Paying Living Expenses:   Food Insecurity: No Food Insecurity  . Worried About 03/30/2018 in the Last Year: Never true  . Ran Out of Food in the Last Year: Never true  Transportation Needs: Unmet Transportation Needs  . Lack of Transportation (Medical): Yes  . Lack of Transportation (Non-Medical): Yes  Physical Activity:   . Days of Exercise per Week:   . Minutes of Exercise per Session:   Stress:   . Feeling of Stress :   Social Connections:   . Frequency of Communication with Friends and Family:   . Frequency of Social Gatherings with Friends and Family:   . Attends Religious Services:   . Active Member of Clubs or Organizations:   . Attends Programme researcher, broadcasting/film/video Meetings:   Banker Marital Status:     Family History: History reviewed. No pertinent family history.  Allergies: No Known Allergies  Medications Prior to Admission  Medication Sig Dispense Refill Last Dose  . acetaminophen (TYLENOL) 500 MG tablet Take 2 tablets (1,000 mg total) by mouth every 8 (eight) hours as needed for moderate pain. (Patient not taking: Reported on 01/04/2020) 30 tablet 0   . Prenatal Vit-Fe Fumarate-FA (PRENATAL PO) Take by mouth.  Review of Systems   All systems reviewed and negative except as stated in HPI  Blood pressure 140/73, pulse 91, temperature 98.4 F (36.9 C), temperature source Oral, resp. rate 18, last menstrual period 05/13/2019, unknown if currently breastfeeding. General appearance: alert, cooperative and no distress Lungs: normal effort Heart: regular rate  Abdomen: soft, non-tender; bowel sounds normal Pelvic: Deferred Extremities: Homans sign is negative, no sign of DVT Presentation: cephalic Fetal monitoringBaseline: 120 bpm, Variability: Good {> 6 bpm) and Accelerations: Reactive Uterine activity: None     Prenatal labs: ABO, Rh: --/--/A POS, A POS Performed at [facility] [address] [place],  [address]  [id number]) Antibody:  NEG (03/31 1847) Rubella: Immune (11/30 0000) RPR: Non Reactive (01/08 0909)  HBsAg: Negative (11/30 0000)  HIV: Non Reactive (01/08 0909)  GBS: Positive/-- (03/12 1138)  2 hr Glucola wnl Genetic screening  None in chart Anatomy US Previously polyhydramnios but since resolved  Prenatal Transfer Tool  Maternal Diabetes: No Genetic Screening: None in chart Maternal Ultrasounds/Referrals: Normal, previously showed polyhydramnios that since resolved Fetal Ultrasounds or other Referrals:  None Maternal Substance Abuse:  No Significant Maternal Medications:  None Significant Maternal Lab Results: Group B Strep positive  Results for orders placed or performed during the hospital encounter of 02/18/20 (from the past 24 hour(s))  CBC   Collection Time: 02/18/20  8:35 AM  Result Value Ref Range   WBC 5.7 4.0 - 10.5 K/uL   RBC 3.53 (L) 3.87 - 5.11 MIL/uL   Hemoglobin 9.8 (L) 12.0 - 15.0 g/dL   HCT 31.0 (L) 36.0 - 46.0 %   MCV 87.8 80.0 - 100.0 fL   MCH 27.8 26.0 - 34.0 pg   MCHC 31.6 30.0 - 36.0 g/dL   RDW 14.9 11.5 - 15.5 %   Platelets 234 150 - 400 K/uL   nRBC 0.4 (H) 0.0 - 0.2 %  Comprehensive metabolic panel   Collection Time: 02/18/20  8:35 AM  Result Value Ref Range   Sodium 137 135 - 145 mmol/L   Potassium 3.2 (L) 3.5 - 5.1 mmol/L   Chloride 110 98 - 111 mmol/L   CO2 18 (L) 22 - 32 mmol/L   Glucose, Bld 86 70 - 99 mg/dL   BUN <5 (L) 6 - 20 mg/dL   Creatinine, Ser 0.73 0.44 - 1.00 mg/dL   Calcium 8.2 (L) 8.9 - 10.3 mg/dL   Total Protein 5.3 (L) 6.5 - 8.1 g/dL   Albumin 2.0 (L) 3.5 - 5.0 g/dL   AST 15 15 - 41 U/L   ALT 10 0 - 44 U/L   Alkaline Phosphatase 192 (H) 38 - 126 U/L   Total Bilirubin 0.3 0.3 - 1.2 mg/dL   GFR calc non Af Amer >60 >60 mL/min   GFR calc Af Amer >60 >60 mL/min   Anion gap 9 5 - 15  Results for orders placed or performed during the hospital encounter of 02/17/20 (from the past 24 hour(s))  Urinalysis, Routine w reflex microscopic   Collection  Time: 02/17/20  6:10 PM  Result Value Ref Range   Color, Urine YELLOW YELLOW   APPearance CLEAR CLEAR   Specific Gravity, Urine 1.004 (L) 1.005 - 1.030   pH 7.0 5.0 - 8.0   Glucose, UA NEGATIVE NEGATIVE mg/dL   Hgb urine dipstick NEGATIVE NEGATIVE   Bilirubin Urine NEGATIVE NEGATIVE   Ketones, ur NEGATIVE NEGATIVE mg/dL   Protein, ur 100 (A) NEGATIVE mg/dL   Nitrite  NEGATIVE NEGATIVE   Leukocytes,Ua NEGATIVE NEGATIVE   RBC / HPF 0-5 0 - 5 RBC/hpf   WBC, UA 0-5 0 - 5 WBC/hpf   Bacteria, UA RARE (A) NONE SEEN   Squamous Epithelial / LPF 0-5 0 - 5  CBC   Collection Time: 02/17/20  6:28 PM  Result Value Ref Range   WBC 5.8 4.0 - 10.5 K/uL   RBC 3.76 (L) 3.87 - 5.11 MIL/uL   Hemoglobin 10.5 (L) 12.0 - 15.0 g/dL   HCT 81.8 (L) 29.9 - 37.1 %   MCV 87.0 80.0 - 100.0 fL   MCH 27.9 26.0 - 34.0 pg   MCHC 32.1 30.0 - 36.0 g/dL   RDW 69.6 78.9 - 38.1 %   Platelets 242 150 - 400 K/uL   nRBC 0.3 (H) 0.0 - 0.2 %  Comprehensive metabolic panel   Collection Time: 02/17/20  6:28 PM  Result Value Ref Range   Sodium 136 135 - 145 mmol/L   Potassium 3.4 (L) 3.5 - 5.1 mmol/L   Chloride 109 98 - 111 mmol/L   CO2 17 (L) 22 - 32 mmol/L   Glucose, Bld 86 70 - 99 mg/dL   BUN 5 (L) 6 - 20 mg/dL   Creatinine, Ser 0.17 0.44 - 1.00 mg/dL   Calcium 8.4 (L) 8.9 - 10.3 mg/dL   Total Protein 5.4 (L) 6.5 - 8.1 g/dL   Albumin 2.1 (L) 3.5 - 5.0 g/dL   AST 18 15 - 41 U/L   ALT 11 0 - 44 U/L   Alkaline Phosphatase 199 (H) 38 - 126 U/L   Total Bilirubin 0.3 0.3 - 1.2 mg/dL   GFR calc non Af Amer >60 >60 mL/min   GFR calc Af Amer >60 >60 mL/min   Anion gap 10 5 - 15  Type and screen   Collection Time: 02/17/20  6:47 PM  Result Value Ref Range   ABO/RH(D) A POS    Antibody Screen NEG    Sample Expiration      02/20/2020,2359 Performed at [facility], [address], [place], [place] [address]   ABO/Rh   Collection Time: 02/17/20  6:47 PM  Result Value Ref Range   ABO/RH(D)      A  POS Performed at [facility], [address] [place], [place] [address]   Protein / creatinine ratio, urine   Collection Time: 02/17/20  6:55 PM  Result Value Ref Range   Creatinine, Urine 35.35 mg/dL   Total Protein, Urine 260 mg/dL   Protein Creatinine Ratio 7.36 (H) 0.00 - 0.15 mg/mg[Cre]    Patient Active Problem List   Diagnosis Date Noted  . Severe preeclampsia, third trimester 02/18/2020  . Group B Streptococcus carrier, +RV culture, currently pregnant 02/01/2020  . Supervision of high risk pregnancy, antepartum 11/02/2019  . Language barrier 11/02/2019  . History of C-section 11/02/2019  . Obesity in pregnancy 11/02/2019  . Gestational proteinuria, antepartum 11/02/2019    Assessment/Plan:  Kristine Nunez is a 29 y.o. [id number] at [redacted]w[redacted]d here for IOL for Severe Pre-eclampsia.   #Labor: Vertex by exam. VBAC consent signed 11/02/19 and patient wishes to [facility] today. Hx of 3 prior vaginal deliveries and last delivery in [place] was a c-section as she progressed to 10 cm but then ctx were "not strong enough for delivery." Foley bulb placed manually with 60 mL water, patient tolerated well. Will start Pitocin with Pit max 6 while FB in place then max 20 unless  IUPC placed. AROM PRN. Anticipate VBAC.  #Pain: Analgesia PRN #FWB: Cat I, EFW 3300g #ID: GBS positive, PCN #MOF: Both #MOC:IUD outpatient #Circ:  NA; girl #Severe PreE: Patient with severe range pressures in MAU. Pr/Cr 7.36. CBC/CMP WNL. Labetalol protocol and Mag.   Joselyn Arrow, MD  02/18/2020, 9:41 AM

## 2020-02-18 NOTE — Progress Notes (Signed)
Labor Progress Note Rayyan Demeritt is a 29 y.o. N9G9211 at [redacted]w[redacted]d presented for IOL for severe Pre-E. S: Desires exam. Not feeling pressure but feeling stronger ctx.   O:  BP (!) 151/99   Pulse 96   Temp 98.1 F (36.7 C)   Resp 18   LMP 05/13/2019 (Approximate)  EFM: 120, moderate variability, pos accels, no decels, reactive TOCO: difficult to trace  CVE: Dilation: 7 Effacement (%): 80 Station: -2 Presentation: Vertex Exam by:: Dr. Morene Antu   A&P: 29 y.o. [id number] [redacted]w[redacted]d here for IOL for Severe Pre-E. #Labor: Progressing well. S/p Foley balloon and AROM. Cont Pit. IUPC placed due to lack of progress in TOLAC and Pit at 20; titrate to adequate MVU's. Anticipate VBAC. #Pain: per patient request #FWB: Cat I #GBS positive; PCN #Severe Pre-E: Cont Mag and Labetalol protocol. BP's mild range.   Joselyn Arrow, MD 8:07 PM

## 2020-02-18 NOTE — MAU Note (Signed)
Pt returned, for direct admit per [facility], (pt had left AMA last night saying she would return this morning).

## 2020-02-18 NOTE — Progress Notes (Signed)
Labor Progress Note Maelani Check is a 29 y.o. [id number] at [redacted]w[redacted]d presented for IOL for severe Pre-E. S: Feeling stronger ctx, rating at 8/10.  O:  BP (!) 150/86   Pulse 79   Temp (!) 97.4 F (36.3 C) (Oral)   Resp 18   LMP 05/13/2019 (Approximate)  EFM: 120, moderate variability, pos accels, no decels, reactive TOCO: q3-64m (some inverted)  CVE: Dilation: 5.5 Effacement (%): 70 Station: -3 Presentation: Vertex Exam by:: Dr. Morene Antu   A&P: 29 y.o. [id number] [redacted]w[redacted]d here for IOL for Severe Pre-E. #Labor: Progressing well. S/p Foley balloon. Cont Pit. AROM with clear fluid at this exam. Anticipate VBAC. #Pain: per patient request #FWB: Cat I #GBS positive; PCN #Severe Pre-E: Cont Mag and Labetalol protocol. BP's mild range.   Joselyn Arrow, MD 4:54 PM

## 2020-02-19 ENCOUNTER — Encounter (HOSPITAL_COMMUNITY): Payer: Self-pay | Admitting: Obstetrics and Gynecology

## 2020-02-19 ENCOUNTER — Inpatient Hospital Stay (HOSPITAL_COMMUNITY): Payer: Medicaid Other | Admitting: Anesthesiology

## 2020-02-19 DIAGNOSIS — Z3A40 40 weeks gestation of pregnancy: Secondary | ICD-10-CM

## 2020-02-19 DIAGNOSIS — O34211 Maternal care for low transverse scar from previous cesarean delivery: Secondary | ICD-10-CM

## 2020-02-19 DIAGNOSIS — O1414 Severe pre-eclampsia complicating childbirth: Secondary | ICD-10-CM

## 2020-02-19 DIAGNOSIS — O99824 Streptococcus B carrier state complicating childbirth: Secondary | ICD-10-CM

## 2020-02-19 LAB — CBC WITH DIFFERENTIAL/PLATELET
Abs Immature Granulocytes: 0.05 10*3/uL (ref 0.00–0.07)
Basophils Absolute: 0 10*3/uL (ref 0.0–0.1)
Basophils Relative: 0 %
Eosinophils Absolute: 0 10*3/uL (ref 0.0–0.5)
Eosinophils Relative: 0 %
HCT: 34.6 % — ABNORMAL LOW (ref 36.0–46.0)
Hemoglobin: 11.1 g/dL — ABNORMAL LOW (ref 12.0–15.0)
Immature Granulocytes: 0 %
Lymphocytes Relative: 9 %
Lymphs Abs: 1.1 10*3/uL (ref 0.7–4.0)
MCH: 27.6 pg (ref 26.0–34.0)
MCHC: 32.1 g/dL (ref 30.0–36.0)
MCV: 86.1 fL (ref 80.0–100.0)
Monocytes Absolute: 0.6 10*3/uL (ref 0.1–1.0)
Monocytes Relative: 5 %
Neutro Abs: 10.5 10*3/uL — ABNORMAL HIGH (ref 1.7–7.7)
Neutrophils Relative %: 86 %
Platelets: 282 10*3/uL (ref 150–400)
RBC: 4.02 MIL/uL (ref 3.87–5.11)
RDW: 14.8 % (ref 11.5–15.5)
WBC: 12.4 10*3/uL — ABNORMAL HIGH (ref 4.0–10.5)
nRBC: 0 % (ref 0.0–0.2)

## 2020-02-19 LAB — DIC (DISSEMINATED INTRAVASCULAR COAGULATION)PANEL
D-Dimer, Quant: 3.94 ug/mL-FEU — ABNORMAL HIGH (ref 0.00–0.50)
Fibrinogen: 487 mg/dL — ABNORMAL HIGH (ref 210–475)
INR: 1.1 (ref 0.8–1.2)
Platelets: 264 10*3/uL (ref 150–400)
Prothrombin Time: 14.2 seconds (ref 11.4–15.2)
Smear Review: NONE SEEN
aPTT: 26 seconds (ref 24–36)

## 2020-02-19 LAB — COMPREHENSIVE METABOLIC PANEL
ALT: 12 U/L (ref 0–44)
AST: 23 U/L (ref 15–41)
Albumin: 1.5 g/dL — ABNORMAL LOW (ref 3.5–5.0)
Alkaline Phosphatase: 170 U/L — ABNORMAL HIGH (ref 38–126)
Anion gap: 10 (ref 5–15)
BUN: <5 mg/dL — ABNORMAL LOW (ref 6–20)
CO2: 17 mmol/L — ABNORMAL LOW (ref 22–32)
Calcium: 6.6 mg/dL — ABNORMAL LOW (ref 8.9–10.3)
Chloride: 101 mmol/L (ref 98–111)
Creatinine, Ser: 0.83 mg/dL (ref 0.44–1.00)
GFR calc Af Amer: >60 mL/min (ref >60)
GFR calc non Af Amer: >60 mL/min (ref >60)
Glucose, Bld: 126 mg/dL — ABNORMAL HIGH (ref 70–99)
Potassium: 3.4 mmol/L — ABNORMAL LOW (ref 3.5–5.1)
Sodium: 128 mmol/L — ABNORMAL LOW (ref 135–145)
Total Bilirubin: 0.4 mg/dL (ref 0.3–1.2)
Total Protein: 4.5 g/dL — ABNORMAL LOW (ref 6.5–8.1)

## 2020-02-19 LAB — CBC
HCT: 28 % — ABNORMAL LOW (ref 36.0–46.0)
HCT: 29.6 % — ABNORMAL LOW (ref 36.0–46.0)
HCT: 23.9 % — ABNORMAL LOW (ref 36.0–46.0)
Hemoglobin: 8.8 g/dL — ABNORMAL LOW (ref 12.0–15.0)
Hemoglobin: 9.1 g/dL — ABNORMAL LOW (ref 12.0–15.0)
Hemoglobin: 7.8 g/dL — ABNORMAL LOW (ref 12.0–15.0)
MCH: 27.5 pg (ref 26.0–34.0)
MCH: 27.8 pg (ref 26.0–34.0)
MCH: 28.1 pg (ref 26.0–34.0)
MCHC: 31.4 g/dL (ref 30.0–36.0)
MCHC: 30.7 g/dL (ref 30.0–36.0)
MCHC: 32.6 g/dL (ref 30.0–36.0)
MCV: 87.5 fL (ref 80.0–100.0)
MCV: 90.5 fL (ref 80.0–100.0)
MCV: 86 fL (ref 80.0–100.0)
Platelets: 266 10*3/uL (ref 150–400)
Platelets: 297 10*3/uL (ref 150–400)
Platelets: 279 10*3/uL (ref 150–400)
RBC: 3.2 MIL/uL — ABNORMAL LOW (ref 3.87–5.11)
RBC: 3.27 MIL/uL — ABNORMAL LOW (ref 3.87–5.11)
RBC: 2.78 MIL/uL — ABNORMAL LOW (ref 3.87–5.11)
RDW: 15.1 % (ref 11.5–15.5)
RDW: 15.1 % (ref 11.5–15.5)
RDW: 15 % (ref 11.5–15.5)
WBC: 8.7 10*3/uL (ref 4.0–10.5)
WBC: 17.3 10*3/uL — ABNORMAL HIGH (ref 4.0–10.5)
WBC: 15.5 10*3/uL — ABNORMAL HIGH (ref 4.0–10.5)
nRBC: 0 % (ref 0.0–0.2)
nRBC: 0 % (ref 0.0–0.2)
nRBC: 0 % (ref 0.0–0.2)

## 2020-02-19 LAB — SAVE SMEAR(SSMR), FOR PROVIDER SLIDE REVIEW

## 2020-02-19 LAB — RPR: RPR Ser Ql: NONREACTIVE

## 2020-02-19 LAB — PREPARE RBC (CROSSMATCH)

## 2020-02-19 MED ORDER — LACTATED RINGERS IV SOLN: INTRAVENOUS | Status: DC

## 2020-02-19 MED ORDER — DIPHENHYDRAMINE HCL 50 MG/ML IJ SOLN: 12.5000 mg | INTRAMUSCULAR | Status: DC | PRN

## 2020-02-19 MED ORDER — LIDOCAINE-EPINEPHRINE (PF) 2 %-1:200000 IJ SOLN
INTRAMUSCULAR | Status: DC | PRN
  Administered 2020-02-19: 3 mL via EPIDURAL
  Administered 2020-02-19: 2 mL via EPIDURAL

## 2020-02-19 MED ORDER — EPHEDRINE 5 MG/ML INJ: 10.0000 mg | INTRAVENOUS | Status: DC | PRN

## 2020-02-19 MED ORDER — ONDANSETRON HCL 4 MG PO TABS: 4.0000 mg | ORAL_TABLET | ORAL | Status: DC | PRN

## 2020-02-19 MED ORDER — LACTATED RINGERS IV BOLUS: 1000.0000 mL | Freq: Once | INTRAVENOUS | Status: DC

## 2020-02-19 MED ORDER — MAGNESIUM SULFATE 40 GM/1000ML IV SOLN
2.0000 g/h | INTRAVENOUS | Status: AC
  Administered 2020-02-19 (×2): 2 g/h via INTRAVENOUS
  Filled 2020-02-19: qty 1000

## 2020-02-19 MED ORDER — MISOPROSTOL 200 MCG PO TABS: 1000.0000 ug | ORAL_TABLET | Freq: Once | ORAL | Status: AC

## 2020-02-19 MED ORDER — SODIUM CHLORIDE 0.9% IV SOLUTION: Freq: Once | INTRAVENOUS | Status: DC

## 2020-02-19 MED ORDER — COCONUT OIL OIL: 1.0000 "application " | TOPICAL_OIL | Status: DC | PRN

## 2020-02-19 MED ORDER — DIPHENHYDRAMINE HCL 25 MG PO CAPS: 25.0000 mg | ORAL_CAPSULE | Freq: Four times a day (QID) | ORAL | Status: DC | PRN

## 2020-02-19 MED ORDER — PRENATAL MULTIVITAMIN CH
1.0000 | ORAL_TABLET | Freq: Every day | ORAL | Status: DC
  Administered 2020-02-20: 1 via ORAL
  Filled 2020-02-19: qty 1

## 2020-02-19 MED ORDER — TRANEXAMIC ACID-NACL 1000-0.7 MG/100ML-% IV SOLN: 1000.0000 mg | INTRAVENOUS | Status: AC

## 2020-02-19 MED ORDER — WITCH HAZEL-GLYCERIN EX PADS: 1.0000 "application " | MEDICATED_PAD | CUTANEOUS | Status: DC | PRN

## 2020-02-19 MED ORDER — PHENYLEPHRINE 40 MCG/ML (10ML) SYRINGE FOR IV PUSH (FOR BLOOD PRESSURE SUPPORT)
80.0000 ug | PREFILLED_SYRINGE | INTRAVENOUS | Status: DC | PRN
  Filled 2020-02-19: qty 10

## 2020-02-19 MED ORDER — MISOPROSTOL 200 MCG PO TABS
ORAL_TABLET | ORAL | Status: AC
  Administered 2020-02-19: 1000 ug via RECTAL
  Filled 2020-02-19: qty 5

## 2020-02-19 MED ORDER — FENTANYL-BUPIVACAINE-NACL 0.5-0.125-0.9 MG/250ML-% EP SOLN
12.0000 mL/h | EPIDURAL | Status: DC | PRN
  Filled 2020-02-19: qty 250

## 2020-02-19 MED ORDER — SENNOSIDES-DOCUSATE SODIUM 8.6-50 MG PO TABS
2.0000 | ORAL_TABLET | ORAL | Status: DC
  Administered 2020-02-20 (×2): 2 via ORAL
  Filled 2020-02-19 (×2): qty 2

## 2020-02-19 MED ORDER — TETANUS-DIPHTH-ACELL PERTUSSIS 5-2.5-18.5 LF-MCG/0.5 IM SUSP: 0.5000 mL | Freq: Once | INTRAMUSCULAR | Status: DC

## 2020-02-19 MED ORDER — BENZOCAINE-MENTHOL 20-0.5 % EX AERO: 1.0000 "application " | INHALATION_SPRAY | CUTANEOUS | Status: DC | PRN

## 2020-02-19 MED ORDER — SODIUM CHLORIDE (PF) 0.9 % IJ SOLN
INTRAMUSCULAR | Status: DC | PRN
  Administered 2020-02-19: 12 mL/h via EPIDURAL

## 2020-02-19 MED ORDER — PHENYLEPHRINE 40 MCG/ML (10ML) SYRINGE FOR IV PUSH (FOR BLOOD PRESSURE SUPPORT): 80.0000 ug | PREFILLED_SYRINGE | INTRAVENOUS | Status: DC | PRN

## 2020-02-19 MED ORDER — ACETAMINOPHEN 325 MG PO TABS
650.0000 mg | ORAL_TABLET | ORAL | Status: DC | PRN
  Administered 2020-02-19 – 2020-02-20 (×2): 650 mg via ORAL
  Filled 2020-02-19 (×2): qty 2

## 2020-02-19 MED ORDER — IBUPROFEN 600 MG PO TABS
600.0000 mg | ORAL_TABLET | Freq: Four times a day (QID) | ORAL | Status: DC
  Administered 2020-02-19 – 2020-02-21 (×7): 600 mg via ORAL
  Filled 2020-02-19 (×7): qty 1

## 2020-02-19 MED ORDER — TRANEXAMIC ACID-NACL 1000-0.7 MG/100ML-% IV SOLN
INTRAVENOUS | Status: AC
  Administered 2020-02-19: 1000 mg via INTRAVENOUS
  Filled 2020-02-19: qty 100

## 2020-02-19 MED ORDER — SIMETHICONE 80 MG PO CHEW: 80.0000 mg | CHEWABLE_TABLET | ORAL | Status: DC | PRN

## 2020-02-19 MED ORDER — CEFAZOLIN SODIUM-DEXTROSE 2-4 GM/100ML-% IV SOLN
2.0000 g | Freq: Once | INTRAVENOUS | Status: AC
  Administered 2020-02-19: 2 g via INTRAVENOUS
  Filled 2020-02-19: qty 100

## 2020-02-19 MED ORDER — DIBUCAINE (PERIANAL) 1 % EX OINT: 1.0000 "application " | TOPICAL_OINTMENT | CUTANEOUS | Status: DC | PRN

## 2020-02-19 MED ORDER — ZOLPIDEM TARTRATE 5 MG PO TABS: 5.0000 mg | ORAL_TABLET | Freq: Every evening | ORAL | Status: DC | PRN

## 2020-02-19 MED ORDER — ONDANSETRON HCL 4 MG/2ML IJ SOLN
4.0000 mg | INTRAMUSCULAR | Status: DC | PRN
  Administered 2020-02-19: 4 mg via INTRAVENOUS
  Filled 2020-02-19: qty 2

## 2020-02-19 MED ORDER — LACTATED RINGERS IV SOLN: 500.0000 mL | Freq: Once | INTRAVENOUS | Status: DC

## 2020-02-19 NOTE — Discharge Summary (Signed)
Postpartum Discharge Summary      Patient Name: Kristine Nunez DOB: 08-Dec-1990 MRN: [id number]  Date of admission: 02/18/2020 Delivering Provider: Juanna Cao T   Date of discharge: 02/21/2020  Admitting diagnosis: Severe preeclampsia, third trimester [O14.13] Intrauterine pregnancy: [redacted]w[redacted]d    Secondary diagnosis:  Active Problems:   Language barrier   History of C-section   Obesity in pregnancy   Group B Streptococcus carrier, +RV culture, currently pregnant   Severe preeclampsia, third trimester   Vacuum-assisted vaginal delivery  Additional problems: slow descent with pushing in second stage, variable decelerations     Discharge diagnosis: Term Pregnancy Delivered, Preeclampsia (severe) and Variable fetal heart rate decelerations                                                                                                Post partum procedures:none  Augmentation: AROM, Pitocin and Foley Balloon  Complications: None  Hospital course:  Induction of Labor With Vaginal Delivery   29y.o. yo [id number] admitted to the hospital 02/18/2020 for induction of labor.  Indication for induction: Preeclampsia.  Patient had an complicated labor course as follows: Membrane Rupture Time/Date: 4:50 PM ,02/18/2020   Intrapartum Procedures: Episiotomy: None [1]                                         Lacerations:  1st degree [2]   Slow progress in labor due to MgSO4 infusion Received Pitocin and progressed slowly Was 7cm for many hours, got epidural and then progressed to complete dilation Pushed well but fetus was having deep variable decels and mother became fatigued Dr LaJuanna Caoonsulted and performed a vacuum assisted vaginal delivery Peds present to support baby who did well.  Patient had delivery of a Viable infant.  Information for the patient's newborn:  [place], Girl [id number]]Delivery Method: Vag-Spont    02/19/2020  Details of delivery can be  found in separate delivery note. Had [place] retained placenta and Banjo currette used in the delivery room. Given TXA, Cytotec and Pitocin for management. Postdelivery hgb dropped to low of 6.5. Patient offered and declined blood transfusion. Received magnesium x 24 hours. BP in the mild range--close f/u to be done. BP meds given at time of discharge for BP control.  Patient had a routine postpartum course. Patient is discharged home 02/21/20. Delivery time: 6:40 AM    Magnesium Sulfate received: Yes BMZ received: No Rhophylac:N/A MMR:No Transfusion:No  Physical exam  Vitals:   02/21/20 0355 02/21/20 0757 02/21/20 1140 02/21/20 1142  BP: 128/77 137/78 (!) 151/98 (!) 144/83  Pulse: 91 94 84 84  Resp: '18 18  18  '$ Temp: 98 F (36.7 C) 98.2 F (36.8 C)  98.7 F (37.1 C)  TempSrc: Oral Oral  Oral  SpO2: 100% 100%  100%   General: alert, cooperative and no distress Lochia: appropriate Uterine Fundus: firm DVT Evaluation: No evidence of DVT seen on physical exam. Labs: Lab Results  Component Value Date   WBC 9.9 02/20/2020   HGB 6.5 (LL) 02/20/2020   HCT 20.2 (L) 02/20/2020   MCV 87.4 02/20/2020   PLT 275 02/20/2020   CMP Latest Ref Rng & Units 02/19/2020  Glucose 70 - 99 mg/dL 126(H)  BUN 6 - 20 mg/dL <5(L)  Creatinine 0.44 - 1.00 mg/dL 0.83  Sodium 135 - 145 mmol/L 128(L)  Potassium 3.5 - 5.1 mmol/L 3.4(L)  Chloride 98 - 111 mmol/L 101  CO2 22 - 32 mmol/L 17(L)  Calcium 8.9 - 10.3 mg/dL 6.6(L)  Total Protein 6.5 - 8.1 g/dL 4.5(L)  Total Bilirubin 0.3 - 1.2 mg/dL 0.4  Alkaline Phos 38 - 126 U/L 170(H)  AST 15 - 41 U/L 23  ALT 0 - 44 U/L 12   Edinburgh Score: No flowsheet data found.  Discharge instruction: per After Visit Summary and "Baby and Me Booklet".  After visit meds:  Allergies as of 02/21/2020   No Known Allergies     Medication List    TAKE these medications   acetaminophen 500 MG tablet Commonly known as: TYLENOL Take 2 tablets (1,000 mg total) by  mouth every 8 (eight) hours as needed for moderate pain.   ibuprofen 600 MG tablet Commonly known as: ADVIL Take 1 tablet (600 mg total) by mouth every 6 (six) hours.   NIFEdipine 30 MG 24 hr tablet Commonly known as: ADALAT CC Take 1 tablet (30 mg total) by mouth 2 (two) times daily.   PRENATAL PO Take by mouth.       Diet: routine diet  Activity: Advance as tolerated. Pelvic rest for 6 weeks.   Outpatient follow up:4 weeks Follow up Appt:No future appointments. Follow up Visit: [place] [facility] Follow up in 4 week(s).   Specialty: Obstetrics and Gynecology Contact information: [address] [place], [place] [id number] [place] [address] [phone]           Please schedule this patient for Postpartum visit in: 1 week with the following provider: Any provider In-Person For C/S patients schedule nurse incision check in weeks 2 weeks: no High risk pregnancy complicated by: HTN(severe preeclampsia) Delivery mode:  Vacuum Anticipated Birth Control:  IUD at pp visit PP Procedures needed: BP check  Schedule Integrated [place] visit: no   Newborn Data: Live born female  Birth Weight:   APGAR: ,   Newborn Delivery   Birth date/time: 02/19/2020 06:40:00 Delivery type: VBAC, Vacuum Assisted      Baby Feeding: Bottle Disposition:home with mother   02/21/2020 Donnamae Jude, MD

## 2020-02-19 NOTE — Anesthesia Preprocedure Evaluation (Signed)
Anesthesia Evaluation  Patient identified by MRN, date of birth, ID band Patient awake    Reviewed: Allergy & Precautions, NPO status , Patient's Chart, lab work & pertinent test results  Airway Mallampati: II  TM Distance: >3 FB Neck ROM: Full    Dental no notable dental hx.    Pulmonary neg pulmonary ROS,    Pulmonary exam normal breath sounds clear to auscultation       Cardiovascular hypertension, Normal cardiovascular exam Rhythm:Regular Rate:Normal     Neuro/Psych negative neurological ROS  negative psych ROS   GI/Hepatic negative GI ROS, Neg liver ROS,   Endo/Other  Morbid obesity (BMI 44)  Renal/GU negative Renal ROS  negative genitourinary   Musculoskeletal negative musculoskeletal ROS (+)   Abdominal   Peds  Hematology  (+) Blood dyscrasia (Hgb 9.8), anemia ,   Anesthesia Other Findings IOL for preE with severe range pressures on mag, TOLAC  Reproductive/Obstetrics (+) Pregnancy                             Anesthesia Physical Anesthesia Plan  ASA: III  Anesthesia Plan: Epidural   Post-op Pain Management:    Induction:   PONV Risk Score and Plan: Treatment may vary due to age or medical condition  Airway Management Planned: Natural Airway  Additional Equipment:   Intra-op Plan:   Post-operative Plan:   Informed Consent: I have reviewed the patients History and Physical, chart, labs and discussed the procedure including the risks, benefits and alternatives for the proposed anesthesia with the patient or authorized representative who has indicated his/her understanding and acceptance.       Plan Discussed with: Anesthesiologist  Anesthesia Plan Comments: (Patient identified. Risks, benefits, options discussed with patient including but not limited to bleeding, infection, nerve damage, paralysis, failed block, incomplete pain control, headache, blood pressure  changes, nausea, vomiting, reactions to medication, itching, and post partum back pain. Confirmed with bedside nurse the patient's most recent platelet count. Confirmed with the patient that they are not taking any anticoagulation, have any bleeding history or any family history of bleeding disorders. Patient expressed understanding and wishes to proceed. All questions were answered. )        Anesthesia Quick Evaluation

## 2020-02-19 NOTE — Progress Notes (Signed)
Pt. W/ nausea. Zofran IV given, Interpretor used Saphina [phone]  to explain medicine and SE.  POC updates re: removal of foley, ambulation and breast feeding given. Pt. Hesitant to pump, education regarding the need for pumping to stimulate production etc, given. She is considering.

## 2020-02-19 NOTE — Progress Notes (Signed)
This note also relates to the following rows which could not be included: Rate - Cannot attach notes to extension rows Line - Cannot attach notes to extension rows  Dr Mayford Knife at bedside. Hold blood at this time and based on Next CBC if trending down will hang blood.

## 2020-02-19 NOTE — Progress Notes (Signed)
Call to room that pt. Was having trouble trying to get up for her first ambulation. She was insisting to NT [name] that she needed to have a BM and get up. Kristine Nunez [id number] was on the [facility]. Upon getting up after instructed on safety measures, she made about three steps towards the bathroom, she bagan closing her eyes and acting as if she would pass out. She was quickly assisted back to the bed. A medium trickle of blood was seen coming down her leg with smaller drops on the floor. She was immediately assessed. No clots, bleeding became minimal upon back to bed (larger trickle likely due to gravity with her first ambulation).

## 2020-02-19 NOTE — Anesthesia Procedure Notes (Signed)
Epidural Patient location during procedure: OB Start time: 02/19/2020 3:15 AM End time: 02/19/2020 3:30 AM  Staffing Anesthesiologist: Elmer Picker, MD Performed: anesthesiologist   Preanesthetic Checklist Completed: patient identified, IV checked, risks and benefits discussed, monitors and equipment checked, pre-op evaluation and timeout performed  Epidural Patient position: sitting Prep: DuraPrep and site prepped and draped Patient monitoring: continuous pulse ox, blood pressure, heart rate and cardiac monitor Approach: midline Location: L3-L4 Injection technique: LOR air  Needle:  Needle type: Tuohy  Needle gauge: 17 G Needle length: 9 cm Needle insertion depth: 6 cm Catheter type: closed end flexible Catheter size: 19 Gauge Catheter at skin depth: 12 cm Test dose: negative  Assessment Sensory level: T8 Events: blood not aspirated, injection not painful, no injection resistance, no paresthesia and negative IV test  Additional Notes Patient identified. Risks/Benefits/Options discussed with patient including but not limited to bleeding, infection, nerve damage, paralysis, failed block, incomplete pain control, headache, blood pressure changes, nausea, vomiting, reactions to medication both or allergic, itching and postpartum back pain. Confirmed with bedside nurse the patient's most recent platelet count. Confirmed with patient that they are not currently taking any anticoagulation, have any bleeding history or any family history of bleeding disorders. Patient expressed understanding and wished to proceed. All questions were answered. Sterile technique was used throughout the entire procedure. Please see nursing notes for vital signs. Test dose was given through epidural catheter and negative prior to continuing to dose epidural or start infusion. Warning signs of high block given to the patient including shortness of breath, tingling/numbness in hands, complete motor block, or  any concerning symptoms with instructions to call for help. Patient was given instructions on fall risk and not to get out of bed. All questions and concerns addressed with instructions to call with any issues or inadequate analgesia.  Reason for block:procedure for pain

## 2020-02-19 NOTE — Lactation Note (Signed)
This note was copied from a baby's chart. Lactation Consultation Note  Patient Name: Girl Angeni Morais Today's Date: 02/19/2020 Reason for consult: Initial assessment;Term P6.  Video Swahili interpreter used.  Mom states she breastfed her previous babies without difficulty.  Newborn is 7 hours old and has been in the nursery receiving oxygen.  Initial glucose was 27 and formula given.  Recent glucoses WNL.  Mom is very sleepy.  Instructed to put baby to breast when she is brought to room.  Encouraged to call for assist.  Maternal Data Does the patient have breastfeeding experience prior to this delivery?: Yes  Feeding    LATCH Score                   Interventions    Lactation Tools Discussed/Used     Consult Status Consult Status: Follow-up Date: 02/20/20 Follow-up type: In-patient    Huston Foley 02/19/2020, 1:51 PM

## 2020-02-19 NOTE — Progress Notes (Addendum)
Pt. Admitted to [facility] after delivery. Kristine Nunez [phone]) from [facility] interpretors used for admission and to answer questions. Verified discussion of care/status was ok with visitor present, pt. Gave consent.  Dr. Ezequiel Essex to the bedside to give update regarding baby's status.  POC discussed with patient and opportunity to ask questions given to patient.

## 2020-02-19 NOTE — Progress Notes (Signed)
Patient ID: Kristine Nunez, female   DOB: 1991/08/25, 29 y.o.   MRN: [id number] Doing better with epidural pain relief 02/19/20 0345  --  85  --  --  143/94   --  99 %  --  --  --  -- TB   02/19/20 0340  --  92  --  --  136/94  --  100 %  --  --  --  -- TB   02/19/20 0335  --  88  --  --  148/92   --  99 %  --  --  --  -- TB   02/19/20 0333  --  87  --  --  145/101   --  --  --  --  --  -- TB   02/19/20 0330  --  90  --  --  142/94   --               FHR stable with small variable decels UCs remain adequate  Dilation: 10 Dilation Complete Date: 02/19/20 Dilation Complete Time: 0340 Effacement (%): 100 Station: Plus 1 Presentation: Vertex Exam by:: Wynelle Bourgeois, CNM  Will let her labor down then start pushing

## 2020-02-19 NOTE — Progress Notes (Signed)
To the room for afternoon assessment. Interpretor Zam Zam [phone], used. POC reviewed with patient. Visitor policy explained to patient.

## 2020-02-19 NOTE — Progress Notes (Signed)
Patient ID: Kristine Nunez, female   DOB: [birth year], 29 y.o.   MRN: [id number] Comfortable but feeling pressure  Vitals:   02/19/20 0410 02/19/20 0415 02/19/20 0433 02/19/20 0500  BP:   (!) 146/96 134/88  Pulse:   89 87  Resp:   19 20  Temp:    98.1 F (36.7 C)  TempSrc:    Oral  SpO2: 99% 99% 98%    FHR stable UCs adequate IUPC curled up, removed  Dilation: 10 Dilation Complete Date: 02/19/20 Dilation Complete Time: 0340 Effacement (%): 100 Station: Plus 1 Presentation: Vertex Exam by:: Wynelle Bourgeois, CNM  Will have her start pushing

## 2020-02-19 NOTE — Progress Notes (Signed)
Patient ID: Kristine Nunez, female   DOB: [birth year], 29 y.o.   MRN: [id number] Having a lot of pain with contractions despite Fentanyl Refuses epidural due to fears of harming back  AVSS  FHR reassuring, but flat at times with small variables UCs adequate per IUPC for several hours  Cervix unchanged at 7cm  Dr Mayford Knife in to discuss plan of care, offer C/S vs epidural and continuing labor She elects to get epidural and continue

## 2020-02-20 DIAGNOSIS — O1414 Severe pre-eclampsia complicating childbirth: Secondary | ICD-10-CM

## 2020-02-20 DIAGNOSIS — Z3A40 40 weeks gestation of pregnancy: Secondary | ICD-10-CM

## 2020-02-20 DIAGNOSIS — O34211 Maternal care for low transverse scar from previous cesarean delivery: Secondary | ICD-10-CM

## 2020-02-20 LAB — CBC
HCT: 20.2 % — ABNORMAL LOW (ref 36.0–46.0)
Hemoglobin: 6.5 g/dL — CL (ref 12.0–15.0)
MCH: 28.1 pg (ref 26.0–34.0)
MCHC: 32.2 g/dL (ref 30.0–36.0)
MCV: 87.4 fL (ref 80.0–100.0)
Platelets: 275 10*3/uL (ref 150–400)
RBC: 2.31 MIL/uL — ABNORMAL LOW (ref 3.87–5.11)
RDW: 15.7 % — ABNORMAL HIGH (ref 11.5–15.5)
WBC: 9.9 10*3/uL (ref 4.0–10.5)
nRBC: 0 % (ref 0.0–0.2)

## 2020-02-20 MED ORDER — LACTATED RINGERS IV SOLN: INTRAVENOUS | Status: DC

## 2020-02-20 MED ORDER — FUROSEMIDE 10 MG/ML IJ SOLN
40.0000 mg | Freq: Once | INTRAMUSCULAR | Status: AC
  Administered 2020-02-20: 40 mg via INTRAVENOUS
  Filled 2020-02-20: qty 4

## 2020-02-20 NOTE — Progress Notes (Signed)
Post Partum Day 1 Subjective: Feeling weak Interested in a blood transfusion  Objective: Blood pressure (!) 106/94, pulse 99, temperature 98.3 F (36.8 C), temperature source Oral, resp. rate 19, last menstrual period 05/13/2019, SpO2 100 %, unknown if currently breastfeeding.  Physical Exam:  General: alert, cooperative and no distress Lochia: appropriate Uterine Fundus: firm Incision:  DVT Evaluation: No evidence of DVT seen on physical exam.  Recent Labs    02/19/20 0843 02/19/20 1829  HGB 9.1* 7.8*  HCT 29.6* 23.9*    Assessment/Plan: CBC at noon, reassess her at that point after Hgb returns  Magnesium is off @0700    Lasix 40 mg for peripartum swelling    LOS: 2 days   02/20/2020, 7:52 AM

## 2020-02-20 NOTE — Progress Notes (Signed)
This [facility] [employer] interpretors used through [facility]. Interpretor [phone]. POC discussed with patient.

## 2020-02-20 NOTE — Plan of Care (Addendum)
Interpretor used to discuss POC. Pt. Is improving today with ambulation. She is a one-person standby assist w/o complaint or signs of dizziness. Ambulated to bathroom with ease. Her bleeding is small. Foley cath to be removed (will be on I/O post Mag). She refuses breakfast from hospital and will have food her family left for her at the front desk. No complaint of pain. Will continue with postpartum course.

## 2020-02-20 NOTE — Progress Notes (Signed)
Patient magnesium sulfate assessment completed @0400 . At time of assessment patient fundal rub performed. Fundus firm, midline, U/E without clots or drainage with rub. Peri pad changed with moderate, thin discharge.   Assisted patient to stand at the side of the bed in order to have NT change soiled bed sheets. Patient was mostly steady on her feet, but continued to use one full assist. Patient had a small trickle of blood with standing. Patient sat on the edge of the bed after new bed sheets were placed.   Interpretor services called at this time due to patient expressing questions. Interpretor ID [id number] given.   Patient expressed that she was feeling dizziness and weakness when she stood up. Patient stated that she did not feel well, that she felt very sick, that she did not want to eat since having the baby, and that she wanted a blood transfusion. RN discussed patient symptoms and clarified that the patient was not having black spots in her vision, was not having nausea, and patient was not in pain. RN reassured patient that patient concerns were valid and would be discussed with the health care team/Provider. Patient assisted to lying position and began resting @0445 .   Dr. called @0455  and informed of patient concerns, assessment findings and most recent CBC hemoglobin result of 7.8. Dr. to see the patient at rounds and discuss patient concerns and course of action. Dr. Despina Hidden clarified magnesium sulfate stop orders as 02/20/20 @0700 , continue LR @ 38mL/hr after magnesium completion, and repeat CBC 02/20/20 @1200 . No other orders received at this time.

## 2020-02-20 NOTE — Progress Notes (Signed)
Discussed risks/benefits of blood transfusion, and new Hgb. She reports feeling better and declines blood at this time.

## 2020-02-21 DIAGNOSIS — Z3A40 40 weeks gestation of pregnancy: Secondary | ICD-10-CM

## 2020-02-21 DIAGNOSIS — O1414 Severe pre-eclampsia complicating childbirth: Secondary | ICD-10-CM

## 2020-02-21 DIAGNOSIS — O34211 Maternal care for low transverse scar from previous cesarean delivery: Secondary | ICD-10-CM

## 2020-02-21 LAB — BPAM RBC
Blood Product Expiration Date: 202104302359
Blood Product Expiration Date: 202104302359
Blood Product Expiration Date: 202104302359
Blood Product Expiration Date: 202105012359
ISSUE DATE / TIME: 202104020757
ISSUE DATE / TIME: 202104020757
Unit Type and Rh: 6200
Unit Type and Rh: 6200
Unit Type and Rh: 6200
Unit Type and Rh: 6200

## 2020-02-21 LAB — TYPE AND SCREEN
ABO/RH(D): A POS
Antibody Screen: NEGATIVE
Unit division: 0
Unit division: 0
Unit division: 0
Unit division: 0

## 2020-02-21 MED ORDER — NIFEDIPINE ER OSMOTIC RELEASE 30 MG PO TB24: 30.0000 mg | ORAL_TABLET | Freq: Two times a day (BID) | ORAL | Status: DC

## 2020-02-21 MED ORDER — IBUPROFEN 600 MG PO TABS: 600.0000 mg | ORAL_TABLET | Freq: Four times a day (QID) | ORAL | 0 refills | Status: AC

## 2020-02-21 MED ORDER — NIFEDIPINE ER 30 MG PO TB24: 30.0000 mg | ORAL_TABLET | Freq: Two times a day (BID) | ORAL | 1 refills | Status: AC

## 2020-02-21 NOTE — Plan of Care (Signed)
Patient to be discharged with printed instructions. No concerns noted. Maxfield Gildersleeve L Tandy Grawe, RN  

## 2020-02-21 NOTE — Discharge Instructions (Signed)

## 2020-02-21 NOTE — Lactation Note (Signed)
This note was copied from a baby's chart. Lactation Consultation Note  Patient Name: Girl Julena Cooksey Today's Date: 02/21/2020 Reason for consult: Mother's request  Swahili interpreter, [phone] used for the following: Mom has no questions for lactation, but did want a doctor to come by to see if infant was ok to go home. As I understood that infant had already received orders for discharge, I went and got Terri, RN to answer Mom's questions.   I noted that infant recently finished drinking 50 mL of formula.    Lurline Hare [facility] 02/21/2020, 2:12 PM

## 2020-02-21 NOTE — Lactation Note (Addendum)
This note was copied from a baby's chart. Lactation Consultation Note  Patient Name: Kristine Nunez Today's Date: 02/21/2020 Reason for consult: Follow-up assessment;Term P5, 43 hour term female infant. Mom's hx: HTN, severe pre-eclampsia on Mag. Swahili interpreter used # [phone] Alan Ripper. Per mom, she has been breastfeeding infant and then other feedings supplementing infant with 30 mls of formula. Per mom, infant latches at breast well and will breastfed for 20 minutes mom is experienced at breastfeeding this is her 4th child.  LC did not observe latch infant last fed at 1 pm. LC discussed with mom to latch infant at breast for every feeding first to help establish her milk supply and then supplement with formula afterwards. Mom taught back hand expression and only a smear of colostrum present in breast. LC discussed using DEBP to help with breast stimulation, induction and establishing milk supply mom is agreeable with pumping. Mom understands to use DEBP every 3 hours for 15 minutes on initial setting. Mom shown how to use DEBP & how to disassemble, clean, & reassemble parts. Mom knows to call RN or LC if she has any questions, concerns or need assistance with latching infant at breast. Mom;s feeding plan: 1. Breastfeed infant according to feeding cues, 8 to 12 times within 24 hours, mom with latch infant at breast for every feeding and then supplement with formula until her milk supply is establish. 2. Mom will use DEBP every 3 hours for 15 minutes on initial setting. 3. Mom knows to ask for help if she has any questions, concerns or need assistance with latching infant at breast.  Maternal Data    Feeding Feeding Type: Bottle Fed - Formula(Education given on feeding infant every 2-3hr) Nipple Type: Slow - flow  LATCH Score                   Interventions Interventions: DEBP;Hand express;Expressed milk  Lactation Tools Discussed/Used     Consult Status Consult  Status: Follow-up Date: 02/21/20 Follow-up type: In-patient    Danelle Earthly 02/21/2020, 2:13 AM

## 2020-02-22 LAB — SURGICAL PATHOLOGY

## 2020-02-24 ENCOUNTER — Telehealth: Payer: Self-pay

## 2020-02-24 NOTE — Telephone Encounter (Signed)
Received call from [place], [employer], wanting a return call back in obtaining information on pt's delivery.  If we could call [phone].  Called the number provided and was advised that they had received the information needed from [place] out our office.  No other information needed at this time.    Addison Naegeli, RN

## 2020-02-24 NOTE — Anesthesia Postprocedure Evaluation (Signed)
Anesthesia Post Note  Patient: Kristine Nunez  Procedure(s) Performed: AN AD HOC LABOR EPIDURAL     Patient location during evaluation: Mother Baby Anesthesia Type: Epidural Level of consciousness: awake and alert Pain management: pain level controlled Vital Signs Assessment: post-procedure vital signs reviewed and stable Respiratory status: spontaneous breathing, nonlabored ventilation and respiratory function stable Cardiovascular status: stable Postop Assessment: no headache, no backache and epidural receding Anesthetic complications: no    Last Vitals: There were no vitals filed for this visit.  Last Pain: There were no vitals filed for this visit. Pain Goal:                   Lannie Fields

## 2020-03-02 ENCOUNTER — Ambulatory Visit: Payer: Medicaid Other

## 2020-03-02 DIAGNOSIS — Z029 Encounter for administrative examinations, unspecified: Secondary | ICD-10-CM

## 2020-04-04 ENCOUNTER — Ambulatory Visit: Payer: Medicaid Other | Admitting: Women's Health
